# Patient Record
Sex: Male | Born: 1985 | Race: White | Hispanic: No | Marital: Single | Smoking: Current every day smoker
Health system: Southern US, Community
[De-identification: ages and names within clinical notes are randomized; demographics above are authoritative.]

## PROBLEM LIST (undated history)

## (undated) DIAGNOSIS — F419 Anxiety disorder, unspecified: Secondary | ICD-10-CM

## (undated) DIAGNOSIS — F429 Obsessive-compulsive disorder, unspecified: Secondary | ICD-10-CM

---
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
## 2003-11-03 ENCOUNTER — Emergency Department (HOSPITAL_COMMUNITY): Admission: EM | Admit: 2003-11-03 | Discharge: 2003-11-03 | Payer: Self-pay | Admitting: *Deleted

## 2012-05-18 ENCOUNTER — Emergency Department (HOSPITAL_BASED_OUTPATIENT_CLINIC_OR_DEPARTMENT_OTHER): Payer: Self-pay

## 2012-05-18 ENCOUNTER — Encounter (HOSPITAL_BASED_OUTPATIENT_CLINIC_OR_DEPARTMENT_OTHER): Payer: Self-pay | Admitting: *Deleted

## 2012-05-18 ENCOUNTER — Emergency Department (HOSPITAL_BASED_OUTPATIENT_CLINIC_OR_DEPARTMENT_OTHER)
Admission: EM | Admit: 2012-05-18 | Discharge: 2012-05-18 | Disposition: A | Discharge status: Home / Self Care | Payer: Self-pay | Attending: Emergency Medicine | Admitting: Emergency Medicine

## 2012-05-18 DIAGNOSIS — R042 Hemoptysis: Secondary | ICD-10-CM | POA: Insufficient documentation

## 2012-05-18 DIAGNOSIS — Z8659 Personal history of other mental and behavioral disorders: Secondary | ICD-10-CM | POA: Insufficient documentation

## 2012-05-18 DIAGNOSIS — F17200 Nicotine dependence, unspecified, uncomplicated: Secondary | ICD-10-CM | POA: Insufficient documentation

## 2012-05-18 HISTORY — DX: Anxiety disorder, unspecified: F41.9

## 2012-05-18 LAB — CBC WITH DIFFERENTIAL/PLATELET
Basophils Absolute: 0 10*3/uL (ref 0.0–0.1)
HCT: 43.9 % (ref 39.0–52.0)
Lymphocytes Relative: 31 % (ref 12–46)
MCH: 33.8 pg (ref 26.0–34.0)
MCHC: 37.1 g/dL — ABNORMAL HIGH (ref 30.0–36.0)
MCV: 91.1 fL (ref 78.0–100.0)
Monocytes Relative: 10 % (ref 3–12)
Neutro Abs: 2.7 10*3/uL (ref 1.7–7.7)
Neutrophils Relative %: 54 % (ref 43–77)
Platelets: 156 10*3/uL (ref 150–400)
RDW: 11.5 % (ref 11.5–15.5)

## 2012-05-18 NOTE — ED Notes (Signed)
Patient states he has been spitting up blood since the end of Nov 2013.  States he mainly has bloody sputum in the morning and after eating.  Not associated with coughing.  Feels like he may have acid reflux.  Also has swelling in the right lower abdomen.

## 2012-05-18 NOTE — ED Provider Notes (Signed)
History    CSN: [id number] Arrival date & time 05/18/12  1541 First MD Initiated Contact with Patient 05/18/12 1638      Chief Complaint  Patient presents with  . Hemoptysis    HPI Pt has been spitting up blood intermittently since November.  No coughing of blood.  No spitting of blood.  No blood in the stool.  Occsnl nosebleeds in the am.  He mostly has been noticing the blood in the morning when he spits.  He denies abdominal pain, fever, easy bruising or bleeding. No toothache or oral lesions noted Past Medical History  Diagnosis Date  . Anxiety     History reviewed. No pertinent past surgical history.  No family history on file.  History  Substance Use Topics  . Smoking status: Current Every Day Smoker -- 1.0 packs/day    Types: Cigarettes  . Smokeless tobacco: Not on file  . Alcohol Use: No      Review of Systems  All other systems reviewed and are negative.    Allergies  Review of patient's allergies indicates no known allergies.  Home Medications   Current Outpatient Rx  Name  Route  Sig  Dispense  Refill  . IBUPROFEN 400 MG PO TABS   Oral   Take 400 mg by mouth every 6 (six) hours as needed.           BP 144/86  Pulse 89  Temp 98.1 F (36.7 C) (Oral)  Resp 22  Ht 5\' 11"  (1.803 m)  Wt 170 lb (77.111 kg)  BMI 23.71 kg/m2  SpO2 100%  Physical Exam  Nursing note and vitals reviewed. Constitutional: He appears well-developed and well-nourished. No distress.  HENT:  Head: Normocephalic and atraumatic.  Right Ear: External ear normal.  Left Ear: External ear normal.  Mouth/Throat: No oropharyngeal exudate.       No lesions in mouth, no nosebleeds noted  Eyes: Conjunctivae normal are normal. Right eye exhibits no discharge. Left eye exhibits no discharge. No scleral icterus.  Neck: Neck supple. No tracheal deviation present.  Cardiovascular: Normal rate, regular rhythm and intact distal pulses.   Pulmonary/Chest: Effort normal and breath sounds  normal. No stridor. No respiratory distress. He has no wheezes. He has no rales.  Abdominal: Soft. Bowel sounds are normal. He exhibits no distension. There is no tenderness. There is no rebound and no guarding.  Musculoskeletal: He exhibits no edema and no tenderness.  Neurological: He is alert. He has normal strength. No sensory deficit. Cranial nerve deficit:  no gross defecits noted. He exhibits normal muscle tone. He displays no seizure activity. Coordination normal.  Skin: Skin is warm and dry. No rash noted.  Psychiatric: He has a normal mood and affect.    ED Course  Procedures (including critical care time)  Labs Reviewed  CBC WITH DIFFERENTIAL - Abnormal; Notable for the following:    MCHC 37.1 (*)  RULED OUT INTERFERING SUBSTANCES   All other components within normal limits   Dg Chest 2 View  05/18/2012  *RADIOLOGY REPORT*  Clinical Data: Hemoptysis.  Bloody sputum in the morning after eating.  Not associated with coughing.  Question of acid reflux.  CHEST - 2 VIEW  Comparison: 11/01/2003  Findings: The lungs are hyperinflated.  Heart size is normal. There are no focal consolidations or pleural effusions.  No pulmonary edema.  IMPRESSION:  1.  Hyperinflation. 2. No evidence for acute focal abnormality.   Original Report Authenticated By: Norva Pavlov, M.D.  MDM  No obvious source of his spitting of blood in the am.  He has not been actually vomiting blood or coughing up blood.  Could be related to nosebleeds.  At this time there does not appear to be any evidence of an acute emergency medical condition and the patient appears stable for discharge with appropriate outpatient follow up.         Celene Kras, MD 05/18/12 [phone]

## 2012-05-18 NOTE — ED Notes (Signed)
MD at bedside. 

## 2018-08-20 ENCOUNTER — Other Ambulatory Visit: Payer: Self-pay

## 2018-08-20 ENCOUNTER — Encounter (HOSPITAL_COMMUNITY): Payer: Self-pay

## 2018-08-20 ENCOUNTER — Emergency Department (HOSPITAL_COMMUNITY)
Admission: EM | Admit: 2018-08-20 | Discharge: 2018-08-20 | Disposition: A | Discharge status: Home / Self Care | Payer: Self-pay | Attending: Emergency Medicine | Admitting: Emergency Medicine

## 2018-08-20 DIAGNOSIS — Z5321 Procedure and treatment not carried out due to patient leaving prior to being seen by health care provider: Secondary | ICD-10-CM | POA: Insufficient documentation

## 2018-08-20 DIAGNOSIS — F10929 Alcohol use, unspecified with intoxication, unspecified: Secondary | ICD-10-CM | POA: Insufficient documentation

## 2018-08-20 NOTE — ED Triage Notes (Signed)
Pt reports that he wants help to stop drinking. States that he has been drinking every day for about a year. States that this is the first time that he has sought help. Endorses "several beers and half a pint of vodka" PTA. Denies SI.

## 2018-08-20 NOTE — ED Provider Notes (Signed)
Went to see patient and he was gone from the room. Asked nursing staff where he was, they reported that he decided he did not want to be seen and left without being seen. He was not seen by myself or other EDP prior to leaving.    [address], [place], [place] 08/20/18 1900    Charlynne Pander, MD 08/20/18 2026

## 2018-08-23 ENCOUNTER — Emergency Department (HOSPITAL_COMMUNITY)
Admission: EM | Admit: 2018-08-23 | Discharge: 2018-08-24 | Disposition: A | Discharge status: Other Acute Inpatient Hospital | Payer: Self-pay | Attending: Emergency Medicine | Admitting: Emergency Medicine

## 2018-08-23 DIAGNOSIS — Z046 Encounter for general psychiatric examination, requested by authority: Secondary | ICD-10-CM | POA: Insufficient documentation

## 2018-08-23 DIAGNOSIS — F332 Major depressive disorder, recurrent severe without psychotic features: Secondary | ICD-10-CM | POA: Diagnosis present

## 2018-08-23 DIAGNOSIS — F329 Major depressive disorder, single episode, unspecified: Secondary | ICD-10-CM | POA: Insufficient documentation

## 2018-08-23 DIAGNOSIS — R45851 Suicidal ideations: Secondary | ICD-10-CM | POA: Insufficient documentation

## 2018-08-23 DIAGNOSIS — F17210 Nicotine dependence, cigarettes, uncomplicated: Secondary | ICD-10-CM | POA: Insufficient documentation

## 2018-08-23 DIAGNOSIS — Y908 Blood alcohol level of 240 mg/100 ml or more: Secondary | ICD-10-CM | POA: Insufficient documentation

## 2018-08-23 DIAGNOSIS — F1010 Alcohol abuse, uncomplicated: Secondary | ICD-10-CM | POA: Insufficient documentation

## 2018-08-23 HISTORY — DX: Obsessive-compulsive disorder, unspecified: F42.9

## 2018-08-23 NOTE — ED Triage Notes (Addendum)
IVC SI  Pt was attending [facility] 6 to 9 months ago. Pt has history of Anxiety and OCD. Was taking medications up until he left [facility]. November pt. Tried to hang himself, but was stopped by Son. Today, Pt states wanting to hang himself again.   Pt. States having a broken heart. Pt. Unstable on feet. Reports drinking heavily. Pt. Drinks vodka and beer from the time he wakes up till the time he goes to bed. Pt states "i'm an alcoholic. I want to stop drinking." pt states possibility of losing son d/t drinking problem. Per pt. Mother of his child has abandoned them. Pt. States "i'm not violent. I'm just so sad." Pt verbalized multiple time wishes to die during triage. Pt asking "is it going to hurt?" referring to death. "I just don't want to hurt anymore."

## 2018-08-24 ENCOUNTER — Encounter (HOSPITAL_COMMUNITY): Payer: Self-pay

## 2018-08-24 ENCOUNTER — Other Ambulatory Visit: Payer: Self-pay | Admitting: Behavioral Health

## 2018-08-24 ENCOUNTER — Other Ambulatory Visit: Payer: Self-pay

## 2018-08-24 ENCOUNTER — Encounter (HOSPITAL_COMMUNITY): Payer: Self-pay | Admitting: Emergency Medicine

## 2018-08-24 ENCOUNTER — Inpatient Hospital Stay (HOSPITAL_COMMUNITY)
Admission: AD | Admit: 2018-08-24 | Discharge: 2018-08-27 | DRG: 885 | Disposition: A | Discharge status: Home / Self Care | Payer: No Typology Code available for payment source | Attending: Psychiatry | Admitting: Psychiatry

## 2018-08-24 DIAGNOSIS — Y908 Blood alcohol level of 240 mg/100 ml or more: Secondary | ICD-10-CM | POA: Diagnosis present

## 2018-08-24 DIAGNOSIS — F419 Anxiety disorder, unspecified: Secondary | ICD-10-CM | POA: Diagnosis present

## 2018-08-24 DIAGNOSIS — F332 Major depressive disorder, recurrent severe without psychotic features: Principal | ICD-10-CM

## 2018-08-24 DIAGNOSIS — F10229 Alcohol dependence with intoxication, unspecified: Secondary | ICD-10-CM | POA: Diagnosis present

## 2018-08-24 DIAGNOSIS — G4700 Insomnia, unspecified: Secondary | ICD-10-CM | POA: Diagnosis present

## 2018-08-24 DIAGNOSIS — R45851 Suicidal ideations: Secondary | ICD-10-CM | POA: Diagnosis present

## 2018-08-24 DIAGNOSIS — F17210 Nicotine dependence, cigarettes, uncomplicated: Secondary | ICD-10-CM | POA: Diagnosis present

## 2018-08-24 DIAGNOSIS — F10239 Alcohol dependence with withdrawal, unspecified: Secondary | ICD-10-CM | POA: Diagnosis present

## 2018-08-24 DIAGNOSIS — F1020 Alcohol dependence, uncomplicated: Secondary | ICD-10-CM | POA: Diagnosis present

## 2018-08-24 LAB — RAPID URINE DRUG SCREEN, HOSP PERFORMED
Amphetamines: NOT DETECTED
Barbiturates: NOT DETECTED
Benzodiazepines: POSITIVE — AB
Cocaine: NOT DETECTED
Opiates: NOT DETECTED
Tetrahydrocannabinol: NOT DETECTED

## 2018-08-24 LAB — CBC
HCT: 45.1 % (ref 39.0–52.0)
Hemoglobin: 16.2 g/dL (ref 13.0–17.0)
MCH: 36.6 pg — ABNORMAL HIGH (ref 26.0–34.0)
MCHC: 35.9 g/dL (ref 30.0–36.0)
MCV: 101.8 fL — ABNORMAL HIGH (ref 80.0–100.0)
Platelets: DECREASED 10*3/uL (ref 150–400)
RBC: 4.43 MIL/uL (ref 4.22–5.81)
RDW: 12.5 % (ref 11.5–15.5)
WBC: 4 10*3/uL (ref 4.0–10.5)
nRBC: 0 % (ref 0.0–0.2)

## 2018-08-24 LAB — COMPREHENSIVE METABOLIC PANEL
ALT: 112 U/L — ABNORMAL HIGH (ref 0–44)
AST: 148 U/L — ABNORMAL HIGH (ref 15–41)
Albumin: 4.2 g/dL (ref 3.5–5.0)
Alkaline Phosphatase: 77 U/L (ref 38–126)
Anion gap: 16 — ABNORMAL HIGH (ref 5–15)
BUN: 6 mg/dL (ref 6–20)
CO2: 20 mmol/L — ABNORMAL LOW (ref 22–32)
Calcium: 8.6 mg/dL — ABNORMAL LOW (ref 8.9–10.3)
Chloride: 104 mmol/L (ref 98–111)
Creatinine, Ser: 0.69 mg/dL (ref 0.61–1.24)
GFR calc Af Amer: >60 mL/min (ref >60)
GFR calc non Af Amer: >60 mL/min (ref >60)
Glucose, Bld: 86 mg/dL (ref 70–99)
Potassium: 3.7 mmol/L (ref 3.5–5.1)
Sodium: 140 mmol/L (ref 135–145)
Total Bilirubin: 1.2 mg/dL (ref 0.3–1.2)
Total Protein: 7.5 g/dL (ref 6.5–8.1)

## 2018-08-24 LAB — ETHANOL: Alcohol, Ethyl (B): 409 mg/dL (ref <10)

## 2018-08-24 MED ORDER — ONDANSETRON 4 MG PO TBDP: 4.0000 mg | ORAL_TABLET | Freq: Four times a day (QID) | ORAL | Status: DC | PRN

## 2018-08-24 MED ORDER — LOPERAMIDE HCL 2 MG PO CAPS: 2.0000 mg | ORAL_CAPSULE | ORAL | Status: DC | PRN

## 2018-08-24 MED ORDER — ONDANSETRON 4 MG PO TBDP
4.0000 mg | ORAL_TABLET | Freq: Four times a day (QID) | ORAL | Status: DC | PRN
  Administered 2018-08-24: 4 mg via ORAL
  Filled 2018-08-24: qty 1

## 2018-08-24 MED ORDER — VITAMIN B-1 100 MG PO TABS
100.0000 mg | ORAL_TABLET | Freq: Every day | ORAL | Status: DC
  Filled 2018-08-24 (×2): qty 1

## 2018-08-24 MED ORDER — LORAZEPAM 1 MG PO TABS
1.0000 mg | ORAL_TABLET | Freq: Four times a day (QID) | ORAL | Status: DC | PRN
  Administered 2018-08-24: 1 mg via ORAL
  Filled 2018-08-24: qty 1

## 2018-08-24 MED ORDER — LORAZEPAM 1 MG PO TABS
1.0000 mg | ORAL_TABLET | Freq: Two times a day (BID) | ORAL | Status: DC
  Filled 2018-08-24 (×2): qty 1

## 2018-08-24 MED ORDER — TRAZODONE HCL 50 MG PO TABS: 50.0000 mg | ORAL_TABLET | Freq: Every evening | ORAL | Status: DC | PRN

## 2018-08-24 MED ORDER — ONDANSETRON 4 MG PO TBDP
4.0000 mg | ORAL_TABLET | Freq: Four times a day (QID) | ORAL | Status: DC | PRN
  Filled 2018-08-24: qty 1

## 2018-08-24 MED ORDER — ACETAMINOPHEN 325 MG PO TABS: 650.0000 mg | ORAL_TABLET | Freq: Four times a day (QID) | ORAL | Status: DC | PRN

## 2018-08-24 MED ORDER — THIAMINE HCL 100 MG/ML IJ SOLN: 100.0000 mg | Freq: Once | INTRAMUSCULAR | Status: DC

## 2018-08-24 MED ORDER — ACAMPROSATE CALCIUM 333 MG PO TBEC
666.0000 mg | DELAYED_RELEASE_TABLET | Freq: Three times a day (TID) | ORAL | Status: DC
  Administered 2018-08-24 – 2018-08-27 (×8): 666 mg via ORAL
  Filled 2018-08-24 (×12): qty 2

## 2018-08-24 MED ORDER — VITAMIN B-1 100 MG PO TABS
100.0000 mg | ORAL_TABLET | Freq: Every day | ORAL | Status: DC
  Administered 2018-08-25 – 2018-08-27 (×3): 100 mg via ORAL
  Filled 2018-08-24 (×4): qty 1

## 2018-08-24 MED ORDER — NICOTINE POLACRILEX 2 MG MT GUM: 2.0000 mg | CHEWING_GUM | OROMUCOSAL | Status: DC | PRN

## 2018-08-24 MED ORDER — LORAZEPAM 1 MG PO TABS
1.0000 mg | ORAL_TABLET | Freq: Every day | ORAL | Status: DC
  Filled 2018-08-24: qty 1

## 2018-08-24 MED ORDER — LORAZEPAM 1 MG PO TABS: 1.0000 mg | ORAL_TABLET | Freq: Four times a day (QID) | ORAL | Status: DC | PRN

## 2018-08-24 MED ORDER — TRAZODONE HCL 50 MG PO TABS
50.0000 mg | ORAL_TABLET | Freq: Every evening | ORAL | Status: DC | PRN
  Administered 2018-08-24 – 2018-08-26 (×3): 50 mg via ORAL
  Filled 2018-08-24 (×5): qty 1

## 2018-08-24 MED ORDER — ALUM & MAG HYDROXIDE-SIMETH 200-200-20 MG/5ML PO SUSP: 30.0000 mL | ORAL | Status: DC | PRN

## 2018-08-24 MED ORDER — VITAMIN B-1 100 MG PO TABS: 100.0000 mg | ORAL_TABLET | Freq: Every day | ORAL | Status: DC

## 2018-08-24 MED ORDER — LORAZEPAM 1 MG PO TABS
1.0000 mg | ORAL_TABLET | Freq: Once | ORAL | Status: AC
  Administered 2018-08-24: 1 mg via ORAL
  Filled 2018-08-24: qty 1

## 2018-08-24 MED ORDER — ADULT MULTIVITAMIN W/MINERALS CH
1.0000 | ORAL_TABLET | Freq: Every day | ORAL | Status: DC
  Administered 2018-08-24: 1 via ORAL
  Filled 2018-08-24 (×4): qty 1

## 2018-08-24 MED ORDER — LORAZEPAM 1 MG PO TABS
1.0000 mg | ORAL_TABLET | Freq: Four times a day (QID) | ORAL | Status: AC
  Administered 2018-08-24 – 2018-08-25 (×4): 1 mg via ORAL
  Filled 2018-08-24 (×4): qty 1

## 2018-08-24 MED ORDER — HYDROXYZINE HCL 25 MG PO TABS
25.0000 mg | ORAL_TABLET | Freq: Four times a day (QID) | ORAL | Status: DC | PRN
  Administered 2018-08-26 (×2): 25 mg via ORAL
  Filled 2018-08-24 (×2): qty 1

## 2018-08-24 MED ORDER — HYDROXYZINE HCL 25 MG PO TABS: 25.0000 mg | ORAL_TABLET | Freq: Four times a day (QID) | ORAL | Status: DC | PRN

## 2018-08-24 MED ORDER — THIAMINE HCL 100 MG/ML IJ SOLN
100.0000 mg | Freq: Once | INTRAMUSCULAR | Status: AC
  Administered 2018-08-24: 100 mg via INTRAMUSCULAR
  Filled 2018-08-24: qty 2

## 2018-08-24 MED ORDER — NICOTINE 21 MG/24HR TD PT24
21.0000 mg | MEDICATED_PATCH | Freq: Every day | TRANSDERMAL | Status: DC
  Administered 2018-08-24 – 2018-08-27 (×4): 21 mg via TRANSDERMAL
  Filled 2018-08-24 (×6): qty 1

## 2018-08-24 MED ORDER — LORAZEPAM 1 MG PO TABS
1.0000 mg | ORAL_TABLET | Freq: Three times a day (TID) | ORAL | Status: AC
  Administered 2018-08-25 – 2018-08-26 (×3): 1 mg via ORAL
  Filled 2018-08-24 (×3): qty 1

## 2018-08-24 MED ORDER — ADULT MULTIVITAMIN W/MINERALS CH: 1.0000 | ORAL_TABLET | Freq: Every day | ORAL | Status: DC

## 2018-08-24 MED ORDER — LORAZEPAM 1 MG PO TABS
1.0000 mg | ORAL_TABLET | Freq: Four times a day (QID) | ORAL | Status: DC | PRN
  Administered 2018-08-26: 1 mg via ORAL

## 2018-08-24 MED ORDER — ADULT MULTIVITAMIN W/MINERALS CH
1.0000 | ORAL_TABLET | Freq: Every day | ORAL | Status: DC
  Administered 2018-08-24 – 2018-08-27 (×4): 1 via ORAL
  Filled 2018-08-24 (×5): qty 1

## 2018-08-24 MED ORDER — HYDROXYZINE HCL 25 MG PO TABS
25.0000 mg | ORAL_TABLET | Freq: Four times a day (QID) | ORAL | Status: DC | PRN
  Administered 2018-08-24: 25 mg via ORAL
  Filled 2018-08-24: qty 1

## 2018-08-24 NOTE — ED Notes (Signed)
After pt. completing triage in room 3 of [facility] ED pt. was being change into purple paper scrubs. Was in room with pt. assisting pt. to change into scrubs. Was in front of pt. assisting with taking pant off. Pt. instructed to sit in chair behind him.  Pt. slipped and feel on bottom. pt. intoxicated at time of fall last drink 1 hour prior to arrival. Press photographer and Doctor Horton Notified.  Pt. States having no pain. No bruising present.

## 2018-08-24 NOTE — ED Provider Notes (Signed)
  Physical Exam  BP 132/67 (BP Location: Right Arm)   Pulse 89   Temp 98.2 F (36.8 C) (Oral)   Resp 14   Ht 6\' 1"  (1.854 m)   Wt 68 kg   SpO2 94%   BMI 19.79 kg/m   Physical Exam  ED Course/Procedures     Procedures  MDM  Excepted behavioral health.  Finished up paperwork.  Examined patient.       Benjiman Core, MD 08/24/18 [phone]

## 2018-08-24 NOTE — Progress Notes (Signed)
Patient ID: [name], male   DOB: 1985/11/11, 33 y.o.   MRN: [id number]  Admission Note  D) Patient admitted to the adult unit 300 hall. Patient is a 33 year old male who is under [facility]C from [facility]. Patient appeared to be in withdrawal and was tremulous on approach. Patient states he has been drinking "a lot" for the last year. Patient denies SI/HI at this time but does endorse he needs help. Patient unsure if he wants long term treatment at this time and appears to be worried about getting back to his 60 year old son. Patient states his son's mother left one year ago and he has a court date 5/24 for stalking. Patient denies other substance use at this time.  Skin assessment was completed and unremarkable except for generalized bruises to his body from "falling down while drunk". Patient belongings searched with no contraband found. Belongings secured in locker. Vital signs obtained. Snacks and fluids offered.    A) Plan of care, unit policies and patient expectations were explained. Written consents obtained. Patient oriented to the unit and their room. Patient placed on standard q15 safety checks. High fall risk precautions initiated and reviewed with patient.   R) Patient is in no acute distress and verbalizes understanding of information provided. Patient with no concerns at this time. Patient contracts for safety with staff on the unit. Report given to receiving RN.

## 2018-08-24 NOTE — BH Assessment (Signed)
[facility] Assessment Progress Note   Pt BAL was 409 at 00:04.  TTS to see patient after 08:00.

## 2018-08-24 NOTE — ED Notes (Signed)
Pt arrived to Rm 51 via stretcher. Pt ambulated to bathroom and to room w/o difficulty. Pt noted to be alert, oriented, cooperative. Pt noted to be sleeping at this time - no tremors noted. Sitter w/pt.

## 2018-08-24 NOTE — Tx Team (Signed)
Initial Treatment Plan 08/24/2018 11:49 AM Janna Arch NLZ:[id number]    PATIENT STRESSORS: Legal issue Marital or family conflict Substance abuse   PATIENT STRENGTHS: Average or above average intelligence Capable of independent living General fund of knowledge Physical Health   PATIENT IDENTIFIED PROBLEMS: "help manage my withdrawal"  Substance Abuse  Suicide Risk                 DISCHARGE CRITERIA:  Ability to meet basic life and health needs Adequate post-discharge living arrangements Improved stabilization in mood, thinking, and/or behavior Medical problems require only outpatient monitoring Motivation to continue treatment in a less acute level of care Need for constant or close observation no longer present Reduction of life-threatening or endangering symptoms to within safe limits Safe-care adequate arrangements made Verbal commitment to aftercare and medication compliance Withdrawal symptoms are absent or subacute and managed without 24-hour nursing intervention  PRELIMINARY DISCHARGE PLAN: Outpatient therapy  PATIENT/FAMILY INVOLVEMENT: This treatment plan has been presented to and reviewed with the patient, [name].  The patient and family have been given the opportunity to ask questions and make suggestions.  Ferrel Logan, RN 08/24/2018, 11:49 AM
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
## 2018-08-24 NOTE — Progress Notes (Signed)
D: "I'm feeling a lot better." [name] denied SI, HI, and AVH. He denied most S/S of withdrawal but said he felt a little more sensitive to lights/noise. Slight tremor noted. He denied any hx of ETOH-related withdrawal seizures but said he worried about experiencing them while admitted. He was observed in the milieu interacting appropriately with peers.  A: Meds given as ordered. Q15 safety checks maintained. Support/encouragement offered. Explained role of Ativan in preventing seizures and offered reassurance.   R: Pt remains free from harm and continues with treatment. Will continue to monitor for needs/safety.

## 2018-08-24 NOTE — Progress Notes (Signed)
Patient ID: [name], male   DOB: 1985-11-06, 33 y.o.   MRN: [id number]  [place] NOVEL CORONAVIRUS (COVID-19) DAILY CHECK-OFF SYMPTOMS - answer yes or no to each - every day NO YES  Have you had a fever in the past 24 hours?  . Fever (Temp > 37.80C / 100F) X   Have you had any of these symptoms in the past 24 hours? . New Cough .  Sore Throat  .  Shortness of Breath .  Difficulty Breathing .  Unexplained Body Aches   X   Have you had any one of these symptoms in the past 24 hours not related to allergies?   . Runny Nose .  Nasal Congestion .  Sneezing   X   If you have had runny nose, nasal congestion, sneezing in the past 24 hours, has it worsened?  X   EXPOSURES - check yes or no X   Have you traveled outside the state in the past 14 days?  X   Have you been in contact with someone with a confirmed diagnosis of COVID-19 or PUI in the past 14 days without wearing appropriate PPE?  X   Have you been living in the same home as a person with confirmed diagnosis of COVID-19 or a PUI (household contact)?    X   Have you been diagnosed with COVID-19?    X              What to do next: Answered NO to all: Answered YES to anything:   Proceed with unit schedule Follow the [facility] Inpatient Flowsheet.

## 2018-08-24 NOTE — ED Provider Notes (Signed)
[facility] EMERGENCY DEPARTMENT Provider Note   CSN: [id number] Arrival date & time: 08/23/18  2340    History   Chief Complaint Chief Complaint  Patient presents with  . Suicidal    HPI [name] is a 33 y.o. male.     Patient presents to the emergency department with a chief complaint of suicidal thoughts.  He was brought to the emergency department under IVC.  Patient expressed desire to hang himself.  He is also a heavy alcoholic and has been drinking heavily.  He states that he is sad and depressed and just wants to be with his son.  He states that he does not feel violent.  He states that he would like to leave.  Denies drug use, but does endorse alcohol use.  Denies any other associated symptoms.  The history is provided by the patient. No language interpreter was used.    Past Medical History:  Diagnosis Date  . Anxiety   . OCD (obsessive compulsive disorder)     There are no active problems to display for this patient.   History reviewed. No pertinent surgical history.      Home Medications    Prior to Admission medications   Medication Sig Start Date End Date Taking? Authorizing Provider  ibuprofen (ADVIL,MOTRIN) 400 MG tablet Take 400 mg by mouth every 6 (six) hours as needed.    [provider]    Family History History reviewed. No pertinent family history.  Social History Social History   Tobacco Use  . Smoking status: Current Every Day Smoker    Packs/day: 1.00    Types: Cigarettes  . Smokeless tobacco: Never Used  Substance Use Topics  . Alcohol use: Yes  . Drug use: Not Currently     Allergies   Patient has no known allergies.   Review of Systems Review of Systems  All other systems reviewed and are negative.    Physical Exam Updated Vital Signs BP 131/82   Pulse 82   Temp 97.7 F (36.5 C) (Oral)   Resp 18   Ht 6\' 1"  (1.854 m)   Wt 68 kg   SpO2 95%   BMI 19.79 kg/m   Physical  Exam Vitals signs and nursing note reviewed.  Constitutional:      Appearance: He is well-developed.  HENT:     Head: Normocephalic and atraumatic.  Eyes:     Conjunctiva/sclera: Conjunctivae normal.  Neck:     Musculoskeletal: Neck supple.  Cardiovascular:     Rate and Rhythm: Normal rate and regular rhythm.     Heart sounds: No murmur.  Pulmonary:     Effort: Pulmonary effort is normal. No respiratory distress.     Breath sounds: Normal breath sounds.  Abdominal:     Palpations: Abdomen is soft.     Tenderness: There is no abdominal tenderness.  Skin:    General: Skin is warm and dry.  Neurological:     Mental Status: He is alert and oriented to person, place, and time.  Psychiatric:     Comments: Tearful, withdrawn      ED Treatments / Results  Labs (all labs ordered are listed, but only abnormal results are displayed) Labs Reviewed  COMPREHENSIVE METABOLIC PANEL - Abnormal; Notable for the following components:      Result Value   CO2 20 (*)    Calcium 8.6 (*)    AST 148 (*)    ALT 112 (*)  Anion gap 16 (*)    All other components within normal limits  ETHANOL - Abnormal; Notable for the following components:   Alcohol, Ethyl (B) 409 (*)    All other components within normal limits  CBC - Abnormal; Notable for the following components:   MCV 101.8 (*)    MCH 36.6 (*)    All other components within normal limits  RAPID URINE DRUG SCREEN, HOSP PERFORMED - Abnormal; Notable for the following components:   Benzodiazepines POSITIVE (*)    All other components within normal limits    EKG None  Radiology No results found.  Procedures Procedures (including critical care time)  Medications Ordered in ED Medications  LORazepam (ATIVAN) tablet 1 mg (1 mg Oral Given 08/24/18 0138)     Initial Impression / Assessment and Plan / ED Course  I have reviewed the triage vital signs and the nursing notes.  Pertinent labs & imaging results that were available  during my care of the patient were reviewed by me and considered in my medical decision making (see chart for details).        Patient under IVC for suicidal thoughts.  Has history of prior suicide attempts.  Heavy alcoholic.  TTS consulted.  Patient medically clear for psychiatric evaluation.  5:41 AM Still intoxicated and sleeping.  TTS consult pending.  Final Clinical Impressions(s) / ED Diagnoses   Final diagnoses:  Suicidal ideation  Alcohol abuse    ED Discharge Orders    None       Roxy HorsemanBrowning, Sierrah Luevano, PA-C 08/24/18 0541    Shon BatonHorton, Courtney F, MD 08/24/18 [phone]

## 2018-08-24 NOTE — ED Notes (Signed)
TTS at bedside. 

## 2018-08-24 NOTE — ED Notes (Addendum)
[employer] transporting pt to [facility] - ALL belongings - 2 labeled belongings bags - [employer]. Pt aware.

## 2018-08-24 NOTE — [facility] Suicide Risk Assessment (Addendum)
[facility] Admission Suicide Risk Assessment   Nursing information obtained from:  Patient, Review of record Demographic factors:  Male, Caucasian, Access to firearms Current Mental Status:  Suicidal ideation indicated by patient, Suicide plan, Plan includes specific time, place, or method, Self-harm thoughts, Self-harm behaviors, Intention to act on suicide plan Loss Factors:  Loss of significant relationship Historical Factors:  Prior suicide attempts, Family history of suicide, Anniversary of important loss, Impulsivity Risk Reduction Factors:  Sense of responsibility to family, Living with another person, especially a relative  Total Time spent with patient:45 minutes  Principal Problem: MDD (major depressive disorder), recurrent severe, without psychosis (HCC) Diagnosis:  Principal Problem:   MDD (major depressive disorder), recurrent severe, without psychosis (HCC) Active Problems:   Alcohol use disorder, severe, dependence (HCC)  Subjective Data:   Continued Clinical Symptoms:  Alcohol Use Disorder Identification Test Final Score (AUDIT): 35 The "Alcohol Use Disorders Identification Test", Guidelines for Use in Primary Care, Second Edition.  World Science writer [facility]). Score between 0-7:  no or low risk or alcohol related problems. Score between 8-15:  moderate risk of alcohol related problems. Score between 16-19:  high risk of alcohol related problems. Score 20 or above:  warrants further diagnostic evaluation for alcohol dependence and treatment.   CLINICAL FACTORS:  33 year old male, single,has a 47 year old son, currently unemployed, lives with his mother and his son. He presented to ED 5/11, under IVC, reporting depression and suicidal attempt by hanging self , with report that son had to intervene to prevent. Patient denies this, states " basically I just got really drunk".  Admission BAL 409. Does acknowledge his memory of events leading to admission is fragmented. States  he remembers feeling angry because he wanted to travel with his GF but feeling that his mother has been trying to prevent him leaving by controlling his money. Does acknowledge he has been experiencing depression following his teenaged son's mother leaving them last year, but states he has been feeling better after he developed a new relationship some months ago. Denies major neuro-vegetative symptoms leading up to admission, and states " I had actually been feeling better". He reports daily alcohol consumption over recent weeks, drinking 4-5 high concentration beers and sometimes liquor on most days. History of a prior psychiatric admission as a teenager for depression. Denies prior history of suicide attempts, remote history of self cutting at around age 33, denies history of psychosis, denies history of mania or hypomania. He was not taking medications prior to admission. History of alcohol dependence, states he has generally decrease amount he drinks . Denies drug abuse. Denies history of DTs or seizures, occasional blackouts, denies history of DUI.  Family history - father had history of depression and committed suicide by hanging in 2004. A maternal uncle had history of alcohol use disorder.  Dx- Alcohol Dependence, alcohol induced mood disorder versus MDD  Plan- Inpatient admission. Will change Ativan from PRN to standing detox protocol as patient remains  tremulous , tachycardic and slightly flushed following  Ativan PRN earlier today. We discussed starting antidepressant medication but currently declines/minimizes depression at this time. Has been started on Acamprosate for alcohol cravings .      Musculoskeletal: Strength & Muscle Tone: within normal limits (+) distal tremors, facial flushing , no psychomotor agitation Gait & Station: normal Patient leans: N/A  Psychiatric Specialty Exam: Physical Exam  ROS denies fever, no cough , no shortness of breath, no vomiting  Blood  pressure 124/80,  pulse 60, temperature 98.9 F (37.2 C), temperature source Oral, resp. rate 18, height 6\' 1"  (1.854 m), weight 80.3 kg, SpO2 93 %.Body mass index is 23.35 kg/m.  General Appearance: Fairly Groomed  Eye Contact:  Fair  Speech:  Normal Rate  Volume:  Decreased  Mood:  reports mood is "OK", presents vaguely depressed, anxious   Affect:  congruent , vaguely anxious  Thought Process:  Linear and Descriptions of Associations: Intact  Orientation:  Full (Time, Place, and Person)  Thought Content:  no hallucinations, no delusions, not internally preoccupied   Suicidal Thoughts:  No denies suicidal or self injurious ideations, denies homicidal or violent ideations, contracts for safety on unit   Homicidal Thoughts:  No  Memory:  recent and remote grossly intact   Judgement:  Fair  Insight:  Fair  Psychomotor Activity:  (+) distal tremors, but no overt restlessness or agitation  Concentration:  Concentration: Good and Attention Span: Good  Recall:  Good  Fund of Knowledge:  Good  Language:  Good  Akathisia:  Negative  Handed:  Right  AIMS (if indicated):     Assets:  Communication Skills Desire for Improvement Resilience  ADL's:  Intact  Cognition:  WNL  Sleep:         COGNITIVE FEATURES THAT CONTRIBUTE TO RISK:  Closed-mindedness and Loss of executive function    SUICIDE RISK:   Moderate:  Frequent suicidal ideation with limited intensity, and duration, some specificity in terms of plans, no associated intent, good self-control, limited dysphoria/symptomatology, some risk factors present, and identifiable protective factors, including available and accessible social support.  PLAN OF CARE: Patient will be admitted to inpatient psychiatric unit for stabilization and safety. Will provide and encourage milieu participation. Provide medication management and maked adjustments as needed.  Will also provide medication management to address alcohol WDL-Will follow daily.     I certify that inpatient services furnished can reasonably be expected to improve the patient's condition.   Craige CottaFernando A Zonnie Landen, MD 08/24/2018, 4:23 PM

## 2018-08-24 NOTE — ED Notes (Signed)
Report given to Ethelene Browns, RN at [facility].

## 2018-08-24 NOTE — ED Notes (Signed)
Ordered diet tray for pt  

## 2018-08-24 NOTE — ED Notes (Signed)
Belongings inventoried. Two bags placed in locker #6. No valuables to security or meds to pharmacy.
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
## 2018-08-24 NOTE — Progress Notes (Signed)
Pt accepted to [facility], Bed 306-2  Shuvon Rankin, NP is the accepting provider.  Nehemiah Massed, MD is the attending provider.  Call report to [phone]  [facility] ED notified.   Pt is IVC .  Pt may be transported by [facility] Pt scheduled  to arrive at [facility] as soon as transport can be arranged  Carney Bern T. Kaylyn Lim, MSW, LCSW Disposition Clinical Social Work [phone] (cell) [phone] (office)

## 2018-08-24 NOTE — ED Notes (Addendum)
Copies of IVC made and placed on chart. IVC faxed to [facility]. Copy of IVC placed in drawer for medical records. Originals placed in red folder in pyxis room. Pt is not in purple at this time. Belongings placed in locker #6. Not inventoried.

## 2018-08-25 LAB — LIPID PANEL
Cholesterol: 207 mg/dL — ABNORMAL HIGH (ref 0–200)
HDL: >135 mg/dL (ref >40)
Triglycerides: 20 mg/dL (ref <150)
VLDL: 4 mg/dL (ref 0–40)

## 2018-08-25 LAB — HEMOGLOBIN A1C
Hgb A1c MFr Bld: 4.9 % (ref 4.8–5.6)
Mean Plasma Glucose: 93.93 mg/dL

## 2018-08-25 LAB — TSH: TSH: 4.468 u[IU]/mL (ref 0.350–4.500)

## 2018-08-25 NOTE — Tx Team (Signed)
Interdisciplinary Treatment and Diagnostic Plan Update  08/25/2018 Time of Session: 9:21am Kermitt Harjo MRN: [id number]  Principal Diagnosis: MDD (major depressive disorder), recurrent severe, without psychosis ([place])  Secondary Diagnoses: Principal Problem:   MDD (major depressive disorder), recurrent severe, without psychosis ([place]) Active Problems:   Alcohol use disorder, severe, dependence ([place])   Current Medications:  Current Facility-Administered Medications  Medication Dose Route Frequency Provider Last Rate Last Dose  . acamprosate (CAMPRAL) tablet 666 mg  666 mg Oral TID WC Money, Lowry Ram, FNP   666 mg at 08/25/18 4496  . alum & mag hydroxide-simeth (MAALOX/MYLANTA) 200-200-20 MG/5ML suspension 30 mL  30 mL Oral Q4H PRN Mordecai Maes, NP      . hydrOXYzine (ATARAX/VISTARIL) tablet 25 mg  25 mg Oral Q6H PRN Cobos, Myer Peer, MD      . loperamide (IMODIUM) capsule 2-4 mg  2-4 mg Oral PRN Cobos, Myer Peer, MD      . LORazepam (ATIVAN) tablet 1 mg  1 mg Oral Q6H PRN Cobos, Myer Peer, MD      . LORazepam (ATIVAN) tablet 1 mg  1 mg Oral QID Cobos, Myer Peer, MD   1 mg at 08/25/18 7591   Followed by  . LORazepam (ATIVAN) tablet 1 mg  1 mg Oral TID Cobos, Myer Peer, MD       Followed by  . [START ON 08/26/2018] LORazepam (ATIVAN) tablet 1 mg  1 mg Oral BID Cobos, Myer Peer, MD       Followed by  . [START ON 08/28/2018] LORazepam (ATIVAN) tablet 1 mg  1 mg Oral Daily Cobos, Fernando A, MD      . multivitamin with minerals tablet 1 tablet  1 tablet Oral Daily Cobos, Myer Peer, MD   1 tablet at 08/25/18 0921  . nicotine (NICODERM CQ - dosed in mg/24 hours) patch 21 mg  21 mg Transdermal Daily Cobos, Myer Peer, MD   21 mg at 08/25/18 0921  . ondansetron (ZOFRAN-ODT) disintegrating tablet 4 mg  4 mg Oral Q6H PRN Cobos, Fernando A, MD      . thiamine (VITAMIN B-1) tablet 100 mg  100 mg Oral Daily Cobos, Myer Peer, MD   100 mg at 08/25/18 0921  . traZODone (DESYREL) tablet 50  mg  50 mg Oral QHS PRN Cobos, Myer Peer, MD   50 mg at 08/24/18 2104   PTA Medications: Medications Prior to Admission  Medication Sig Dispense Refill Last Dose  . ibuprofen (ADVIL,MOTRIN) 400 MG tablet Take 400 mg by mouth every 6 (six) hours as needed for moderate pain.    unk    Patient Stressors: Legal issue Marital or family conflict Substance abuse  Patient Strengths: Average or above average intelligence Capable of independent living General fund of knowledge Physical Health  Treatment Modalities: Medication Management, Group therapy, Case management,  1 to 1 session with clinician, Psychoeducation, Recreational therapy.   Physician Treatment Plan for Primary Diagnosis: MDD (major depressive disorder), recurrent severe, without psychosis ([place]) Long Term Goal(s): Improvement in symptoms so as ready for discharge Improvement in symptoms so as ready for discharge   Short Term Goals: Ability to identify changes in lifestyle to reduce recurrence of condition will improve Ability to verbalize feelings will improve Ability to disclose and discuss suicidal ideas Ability to demonstrate self-control will improve Ability to identify and develop effective coping behaviors will improve Ability to maintain clinical measurements within normal limits will improve Compliance with prescribed medications will improve Ability to identify  triggers associated with substance abuse/mental health issues will improve  Medication Management: Evaluate patient's response, side effects, and tolerance of medication regimen.  Therapeutic Interventions: 1 to 1 sessions, Unit Group sessions and Medication administration.  Evaluation of Outcomes: Not Met  Physician Treatment Plan for Secondary Diagnosis: Principal Problem:   MDD (major depressive disorder), recurrent severe, without psychosis ([place]) Active Problems:   Alcohol use disorder, severe, dependence ([place])  Long Term Goal(s): Improvement in  symptoms so as ready for discharge Improvement in symptoms so as ready for discharge   Short Term Goals: Ability to identify changes in lifestyle to reduce recurrence of condition will improve Ability to verbalize feelings will improve Ability to disclose and discuss suicidal ideas Ability to demonstrate self-control will improve Ability to identify and develop effective coping behaviors will improve Ability to maintain clinical measurements within normal limits will improve Compliance with prescribed medications will improve Ability to identify triggers associated with substance abuse/mental health issues will improve     Medication Management: Evaluate patient's response, side effects, and tolerance of medication regimen.  Therapeutic Interventions: 1 to 1 sessions, Unit Group sessions and Medication administration.  Evaluation of Outcomes: Not Met   RN Treatment Plan for Primary Diagnosis: MDD (major depressive disorder), recurrent severe, without psychosis ([place]) Long Term Goal(s): Knowledge of disease and therapeutic regimen to maintain health will improve  Short Term Goals: Ability to participate in decision making will improve, Ability to verbalize feelings will improve, Ability to disclose and discuss suicidal ideas, Ability to identify and develop effective coping behaviors will improve and Compliance with prescribed medications will improve  Medication Management: RN will administer medications as ordered by provider, will assess and evaluate patient's response and provide education to patient for prescribed medication. RN will report any adverse and/or side effects to prescribing provider.  Therapeutic Interventions: 1 on 1 counseling sessions, Psychoeducation, Medication administration, Evaluate responses to treatment, Monitor vital signs and CBGs as ordered, Perform/monitor CIWA, COWS, AIMS and Fall Risk screenings as ordered, Perform wound care treatments as  ordered.  Evaluation of Outcomes: Not Met   LCSW Treatment Plan for Primary Diagnosis: MDD (major depressive disorder), recurrent severe, without psychosis ([place]) Long Term Goal(s): Safe transition to appropriate next level of care at discharge, Engage patient in therapeutic group addressing interpersonal concerns.  Short Term Goals: Engage patient in aftercare planning with referrals and resources and Increase skills for wellness and recovery  Therapeutic Interventions: Assess for all discharge needs, 1 to 1 time with Social worker, Explore available resources and support systems, Assess for adequacy in community support network, Educate family and significant other(s) on suicide prevention, Complete Psychosocial Assessment, Interpersonal group therapy.  Evaluation of Outcomes: Not Met   Progress in Treatment: Attending groups: No. Participating in groups: No. Taking medication as prescribed: Yes. Toleration medication: Yes. Family/Significant other contact made: No, will contact:  will contact if given consent to contact Patient understands diagnosis: Yes. Discussing patient identified problems/goals with staff: Yes. Medical problems stabilized or resolved: Yes. Denies suicidal/homicidal ideation: Yes. Issues/concerns per patient self-inventory: No. Other:   New problem(s) identified: No, Describe:  None  New Short Term/Long Term Goal(s):  Patient Goals: "I want to get better"  Discharge Plan or Barriers:   Reason for Continuation of Hospitalization: Medication stabilization Withdrawal symptoms  Estimated Length of Stay: 3-5 days  Attendees: Patient: 08/25/2018   Physician: Dr. Neita Garnet, MD 08/25/2018  Nursing: Elberta Fortis, RN 08/25/2018   RN Care Manager: 08/25/2018  Social Worker: Ardelle Anton, LCSW 08/25/2018  Recreational Therapist:  08/25/2018  Other:  08/25/2018   Other:  08/25/2018   Other: 08/25/2018       Scribe for Treatment Team: Trecia Rogers,  LCSW 08/25/2018 9:39 AM

## 2018-08-25 NOTE — Progress Notes (Signed)
Adult Psychoeducational Group Note  Date:  08/25/2018 Time:  9:07 PM  Group Topic/Focus:  Wrap-Up Group:   The focus of this group is to help patients review their daily goal of treatment and discuss progress on daily workbooks.  Participation Level:  Active  Participation Quality:  Appropriate  Affect:  Appropriate  Cognitive:  Appropriate  Insight: Appropriate  Engagement in Group:  Engaged  Modes of Intervention:  Discussion  Additional Comments:  Pt stated his goal was to work on his discharge plan.  Pt stated he did meet his goal and rated the day at a 5/10.  Antonia Culbertson 08/25/2018, 9:07 PM

## 2018-08-25 NOTE — Progress Notes (Signed)
Recreation Therapy Notes  Date:  5.13.20 Time: 0930 Location: 300 Hall Dayroom  Group Topic: Stress Management  Goal Area(s) Addresses:  Patient will identify positive stress management techniques. Patient will identify benefits of using stress management post d/c.   Intervention:  Stress Management  Activity :  Progressive Muscle Relaxation.  LRT introduced the stress management technique of PMR.  Patients were to listen and follow along with the instructions given by LRT.    Education:  Stress Management, Discharge Planning.   Education Outcome: Acknowledges Education  Clinical Observations/Feedback:  Pt did not attend group.      Srihari Shellhammer, LRT/CTRS         Rakwon Letourneau A 08/25/2018 10:43 AM 

## 2018-08-25 NOTE — Progress Notes (Signed)
Patient ID: [name], male   DOB: 02/17/1986, 33 y.o.   MRN: [id number]  [place] NOVEL CORONAVIRUS (COVID-19) DAILY CHECK-OFF SYMPTOMS - answer yes or no to each - every day NO YES  Have you had a fever in the past 24 hours?  . Fever (Temp > 37.80C / 100F) X   Have you had any of these symptoms in the past 24 hours? . New Cough .  Sore Throat  .  Shortness of Breath .  Difficulty Breathing .  Unexplained Body Aches   X   Have you had any one of these symptoms in the past 24 hours not related to allergies?   . Runny Nose .  Nasal Congestion .  Sneezing   X   If you have had runny nose, nasal congestion, sneezing in the past 24 hours, has it worsened?  X   EXPOSURES - check yes or no X   Have you traveled outside the state in the past 14 days?  X   Have you been in contact with someone with a confirmed diagnosis of COVID-19 or PUI in the past 14 days without wearing appropriate PPE?  X   Have you been living in the same home as a person with confirmed diagnosis of COVID-19 or a PUI (household contact)?    X   Have you been diagnosed with COVID-19?    X              What to do next: Answered NO to all: Answered YES to anything:   Proceed with unit schedule Follow the [facility] Inpatient Flowsheet.    

## 2018-08-25 NOTE — Plan of Care (Signed)
  Problem: Activity: Goal: Interest or engagement in activities will improve Outcome: Progressing   Problem: Coping: Goal: Ability to verbalize frustrations and anger appropriately will improve Outcome: Progressing  D: Pt alert and oriented on the unit. Pt engaging with RN staff and other pts. Pt denies SI/HI, A/VH. Pt is pleasant and cooperative. A: Education, support and encouragement provided, q15 minute safety checks remain in effect. Medications administered per MD orders. R: No reactions/side effects to medicine noted. Pt denies any concerns at this time, and verbally contracts for safety. Pt ambulating on the unit with no issues. Pt remains safe on and off the unit.
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
## 2018-08-25 NOTE — Progress Notes (Signed)
Pt was invited to group via intercom, and the MHT went room to room to inform pts about group. Pt chose not to attend goals/orientation group.  

## 2018-08-25 NOTE — Progress Notes (Signed)
Pt endorsed nausea but when told he would need to avoid fluids for a time after taking Zofran ODT, he asked if he could take it after breakfast. He wanted to drink his morning coffee. Pt says he believes he can keep food down. He says he is feeling better, so much so that he is surprised.

## 2018-08-25 NOTE — Progress Notes (Signed)
D: Pt was in dayroom upon initial approach.  Pt presents with apporopriate affect and mood.  He reports his day was "pretty good."  His goal is "getting out of here" and he reports he feels safe to discharge.  Pt denies SI/HI, denies hallucinations, denies pain.  Pt has been visible in milieu interacting with peers and staff appropriately.  Pt attended evening group.    A: Introduced self to pt.  Met with pt 1:1.  Actively listened to pt and offered support and encouragement.  PRN medication administered for sleep.  Q15 minute safety checks in place.  R: Pt is safe on the unit.  Pt is compliant with medication.  Pt verbally contracts for safety.  Will continue to monitor and assess.

## 2018-08-25 NOTE — [facility] Suicide Risk Assessment (Signed)
[facility] INPATIENT:  Family/Significant Other Suicide Prevention Education  Suicide Prevention Education:  Contact Attempts: Pt's mother, [name], has been identified by the patient as the family member/significant other with whom the patient will be residing, and identified as the person(s) who will aid the patient in the event of a mental health crisis.  With written consent from the patient, two attempts were made to provide suicide prevention education, prior to and/or following the patient's discharge.  We were unsuccessful in providing suicide prevention education.  A suicide education pamphlet was given to the patient to share with family/significant other.  Date and time of first attempt: 08/25/2018 / 1pm Date and time of second attempt:  [name] 08/25/2018, 1:01 PM

## 2018-08-25 NOTE — Progress Notes (Addendum)
[facility]  08/25/2018 10:40 AM Janna ArchZacharias Honda  MRN:  [id number]   Patient is a 33 year old Caucasian male who [facility] under IVC after attempting to hang himself while he was intoxicated with a BAL of 409.  Subjective: Patient reports today that he is feeling much better than he did yesterday after he was receiving medications for withdrawal symptoms from alcohol.  He denies any suicidal or homicidal ideations and denies any hallucinations.  Patient reports that his sleep did improve his appetite has been great since he has been here.  Patient is fixated on being discharged home, but then also states that he understands and needs to be here to finish detox so that he can remain sober after he leaves.Patient denies any medication side effects and reports that the Campral has decreased his cravings tremendously.  Objective: Patient's chart and findings reviewed and discussed with treatment team.  Patient presents in the day room has been seen on the phone.  Patient has not been attending groups and but has been interacting with peers and staff appropriately.  Principal Problem: MDD (major depressive disorder), recurrent severe, without psychosis (HCC) Diagnosis: Principal Problem:   MDD (major depressive disorder), recurrent severe, without psychosis (HCC) Active Problems:   Alcohol use disorder, severe, dependence (HCC)  Total Time spent with patient: 20 minutes  Past Psychiatric History: See H&P  Past Medical History:  Past Medical History:  Diagnosis Date  . Anxiety   . OCD (obsessive compulsive disorder)    History reviewed. No pertinent surgical history. Family History: History reviewed. No pertinent family history. Family Psychiatric  History: See H&P Social History:  Social History   Substance and Sexual Activity  Alcohol Use Yes     Social History   Substance and Sexual Activity  Drug Use Not Currently    Social History    Socioeconomic History  . Marital status: Single    Spouse name: Not on file  . Number of children: Not on file  . Years of education: Not on file  . Highest education level: Not on file  Occupational History  . Not on file  Social Needs  . Financial resource strain: Not on file  . Food insecurity:    Worry: Not on file    Inability: Not on file  . Transportation needs:    Medical: Not on file    Non-medical: Not on file  Tobacco Use  . Smoking status: Current Every Day Smoker    Packs/day: 1.00    Types: Cigarettes  . Smokeless tobacco: Never Used  Substance and Sexual Activity  . Alcohol use: Yes  . Drug use: Not Currently  . Sexual activity: Not on file  Lifestyle  . Physical activity:    Days per week: Not on file    Minutes per session: Not on file  . Stress: Not on file  Relationships  . Social connections:    Talks on phone: Not on file    Gets together: Not on file    Attends religious service: Not on file    Active member of club or organization: Not on file    Attends meetings of clubs or organizations: Not on file    Relationship status: Not on file  Other Topics Concern  . Not on file  Social History Narrative  . Not on file   Additional Social History:  Sleep: Good  Appetite:  Good  Current Medications: Current Facility-Administered Medications  Medication Dose Route Frequency Provider Last Rate Last Dose  . acamprosate (CAMPRAL) tablet 666 mg  666 mg Oral TID WC Money, Gerlene Burdock, FNP   666 mg at 08/25/18 5537  . alum & mag hydroxide-simeth (MAALOX/MYLANTA) 200-200-20 MG/5ML suspension 30 mL  30 mL Oral Q4H PRN Denzil Magnuson, NP      . hydrOXYzine (ATARAX/VISTARIL) tablet 25 mg  25 mg Oral Q6H PRN Misao Fackrell, Rockey Situ, MD      . loperamide (IMODIUM) capsule 2-4 mg  2-4 mg Oral PRN Lane Eland, Rockey Situ, MD      . LORazepam (ATIVAN) tablet 1 mg  1 mg Oral Q6H PRN Brownie Gockel, Rockey Situ, MD      . LORazepam (ATIVAN) tablet  1 mg  1 mg Oral QID Jos Cygan, Rockey Situ, MD   1 mg at 08/25/18 4827   Followed by  . LORazepam (ATIVAN) tablet 1 mg  1 mg Oral TID Mihail Prettyman, Rockey Situ, MD       Followed by  . [START ON 08/26/2018] LORazepam (ATIVAN) tablet 1 mg  1 mg Oral BID Adreonna Yontz, Rockey Situ, MD       Followed by  . [START ON 08/28/2018] LORazepam (ATIVAN) tablet 1 mg  1 mg Oral Daily Yanelly Cantrelle A, MD      . multivitamin with minerals tablet 1 tablet  1 tablet Oral Daily Ailea Rhatigan, Rockey Situ, MD   1 tablet at 08/25/18 0921  . nicotine (NICODERM CQ - dosed in mg/24 hours) patch 21 mg  21 mg Transdermal Daily Donda Friedli, Rockey Situ, MD   21 mg at 08/25/18 0921  . ondansetron (ZOFRAN-ODT) disintegrating tablet 4 mg  4 mg Oral Q6H PRN Amillion Scobee A, MD      . thiamine (VITAMIN B-1) tablet 100 mg  100 mg Oral Daily Joyann Spidle, Rockey Situ, MD   100 mg at 08/25/18 0921  . traZODone (DESYREL) tablet 50 mg  50 mg Oral QHS PRN Aoife Bold, Rockey Situ, MD   50 mg at 08/24/18 2104    Lab Results:  Results for orders placed or performed during the hospital encounter of 08/24/18 (from the past 48 hour(s))  Lipid panel     Status: Abnormal   Collection Time: 08/25/18  6:34 AM  Result Value Ref Range   Cholesterol 207 (H) 0 - 200 mg/dL   Triglycerides 20 <078 mg/dL   HDL >675 >44 mg/dL   Total CHOL/HDL Ratio NOT CALCULATED RATIO   VLDL 4 0 - 40 mg/dL   LDL Cholesterol NOT CALCULATED 0 - 99 mg/dL    Comment: Performed at [facility], [address] [place], [place] [address]  TSH     Status: None   Collection Time: 08/25/18  6:34 AM  Result Value Ref Range   TSH 4.468 0.350 - 4.500 uIU/mL    Comment: Performed by a 3rd Generation assay with a functional sensitivity of <=0.01 uIU/mL. Performed at [facility], [address] [place], [place] [address]     Blood Alcohol level:  Lab Results  Component Value Date   ETH 409 [facility]) 08/24/2018    Metabolic Disorder Labs: No results found for: HGBA1C,  MPG No results found for: PROLACTIN Lab Results  Component Value Date   CHOL 207 (H) 08/25/2018   TRIG 20 08/25/2018   HDL >135 08/25/2018   CHOLHDL NOT CALCULATED 08/25/2018   VLDL 4 08/25/2018   LDLCALC  NOT CALCULATED 08/25/2018    Physical Findings: AIMS: Facial and Oral Movements Muscles of Facial Expression: None, normal Lips and Perioral Area: None, normal Jaw: None, normal Tongue: None, normal,Extremity Movements Upper (arms, wrists, hands, fingers): None, normal Lower (legs, knees, ankles, toes): None, normal, Trunk Movements Neck, shoulders, hips: None, normal, Overall Severity Severity of abnormal movements (highest score from questions above): None, normal Incapacitation due to abnormal movements: None, normal Patient's awareness of abnormal movements (rate only patient's report): No Awareness, Dental Status Current problems with teeth and/or dentures?: No Does patient usually wear dentures?: No  CIWA:  CIWA-Ar Total: 4 COWS:     Musculoskeletal: Strength & Muscle Tone: within normal limits Gait & Station: normal Patient leans: N/A  Psychiatric Specialty Exam: Physical Exam  Nursing note and vitals reviewed. Constitutional: He is oriented to person, place, and time. He appears well-developed and well-nourished.  Respiratory: Effort normal.  Musculoskeletal: Normal range of motion.  Neurological: He is alert and oriented to person, place, and time.  Skin: Skin is warm.    Review of Systems  Constitutional: Negative.   HENT: Negative.   Eyes: Negative.   Respiratory: Negative.   Cardiovascular: Negative.   Gastrointestinal: Negative.   Genitourinary: Negative.   Musculoskeletal: Negative.   Skin: Negative.   Neurological: Negative.   Endo/Heme/Allergies: Negative.   Psychiatric/Behavioral: Positive for substance abuse.    Blood pressure (!) 137/103, pulse (!) 108, temperature 98.5 F (36.9 C), temperature source Oral, resp. rate 20, height   (1.854 m), weight 80.3 kg, SpO2 99 %.Body mass index is 23.35 kg/m.  General Appearance: Casual  Eye Contact:  Good  Speech:  Clear and Coherent and Normal Rate  Volume:  Normal  Mood:  Euthymic  Affect:  Congruent  Thought Process:  Coherent and Descriptions of Associations: Intact  Orientation:  Full (Time, Place, and Person)  Thought Content:  WDL  Suicidal Thoughts:  No  Homicidal Thoughts:  No  Memory:  Immediate;   Good Recent;   Good Remote;   Good  Judgement:  Fair  Insight:  Fair  Psychomotor Activity:  Normal  Concentration:  Concentration: Good and Attention Span: Good  Recall:  Good  Fund of Knowledge:  Good  Language:  Good  Akathisia:  No  Handed:  Right  AIMS (if indicated):     Assets:  Communication Skills Desire for Improvement Financial Resources/Insurance Housing Physical Health Social Support Transportation  ADL's:  Intact  Cognition:  WNL  Sleep:  Number of Hours: 6.25   Problems Addressed MDD severe recurrent Alcohol use disorder severe dependence  Treatment Plan Summary: Daily contact with patient to assess and evaluate symptoms and progress in treatment, Medication management and Plan is to:  Continue Campral 666 mg PO TID for alcohol use Continue Ativan Detox Protocol Continue Trazodone 50 mg PO QHS PRN for insomnia Encourage group therapy participation Continue Q15 minute safety checks CSW to assist with disposition planning  Maryfrances Bunnell, FNP 08/25/2018, 10:40 AM   Attest to NP Progress Note

## 2018-08-26 MED ORDER — CLONIDINE HCL 0.1 MG PO TABS: 0.1000 mg | ORAL_TABLET | Freq: Three times a day (TID) | ORAL | Status: DC | PRN

## 2018-08-26 NOTE — Plan of Care (Signed)
D: Patient is alert, oriented, pleasant, and cooperative. Denies SI, HI, AVH, and verbally contracts for safety. Patient reports he is feeling good and very appreciative of the help he has received here. Patient denies physical symptoms/pain.    A: Medications administered per MD order. Support provided. Patient educated on safety on the unit and medications. Routine safety checks every 15 minutes. Patient stated understanding to tell nurse about any new physical symptoms. Patient understands to tell staff of any needs.     R: No adverse drug reactions noted. Patient verbally contracts for safety. Patient remains safe at this time and will continue to monitor.   Problem: Education: Goal: Mental status will improve Outcome: Progressing   Problem: Safety: Goal: Periods of time without injury will increase Outcome: Progressing   Patient denies SI, HI, AVH, and contracts for safety. Patient remains safe and will continue to monitor.   [place] NOVEL CORONAVIRUS (COVID-19) DAILY CHECK-OFF SYMPTOMS - answer yes or no to each - every day NO YES  Have you had a fever in the past 24 hours?  Fever (Temp > 37.80C / 100F) X   Have you had any of these symptoms in the past 24 hours? New Cough  Sore Throat   Shortness of Breath  Difficulty Breathing  Unexplained Body Aches   X   Have you had any one of these symptoms in the past 24 hours not related to allergies?   Runny Nose  Nasal Congestion  Sneezing   X   If you have had runny nose, nasal congestion, sneezing in the past 24 hours, has it worsened?  X   EXPOSURES - check yes or no X   Have you traveled outside the state in the past 14 days?  X   Have you been in contact with someone with a confirmed diagnosis of COVID-19 or PUI in the past 14 days without wearing appropriate PPE?  X   Have you been living in the same home as a person with confirmed diagnosis of COVID-19 or a PUI (household contact)?    X   Have you been diagnosed  with COVID-19?    X              What to do next: Answered NO to all: Answered YES to anything:   Proceed with unit schedule Follow the [facility] Inpatient Flowsheet.

## 2018-08-26 NOTE — [facility] Group Notes (Signed)
[facility] LCSW Group Therapy Note  Date/Time: 08/26/2018 ; 1:30pm  Type of Therapy/Topic:  Group Therapy:  Feelings about Diagnosis  Participation Level:  Active   Mood: Pleasant   Description of Group:    This group will allow patients to explore their thoughts and feelings about diagnoses they have received. Patients will be guided to explore their level of understanding and acceptance of these diagnoses. Facilitator will encourage patients to process their thoughts and feelings about the reactions of others to their diagnosis, and will guide patients in identifying ways to discuss their diagnosis with significant others in their lives. This group will be process-oriented, with patients participating in exploration of their own experiences as well as giving and receiving support and challenge from other group members.   Therapeutic Goals: 1. Patient will demonstrate understanding of diagnosis as evidence by identifying two or more symptoms of the disorder:  2. Patient will be able to express two feelings regarding the diagnosis 3. Patient will demonstrate ability to communicate their needs through discussion and/or role plays  Summary of Patient Progress:    Pt was engaged throughout group. Pt appeared tearful at the beginning of group and discussed not wanting to talk too in depth because he did not want to cry. Pt explored his thoughts and feelings around the diagnosis that he has and the stigma that he has received from having a mental health diagnosis. Pt discussed stigmas that individuals experiencing having a mental health diagnosis receive. 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 Pt engaged with other group members in regards to their substance abuse diagnosis and gave insight on his own detox experience.     Therapeutic Modalities:   Cognitive Behavioral Therapy Brief Therapy Feelings Identification   Stephannie Peters, LCSW

## 2018-08-26 NOTE — [facility] Suicide Risk Assessment (Signed)
[facility] INPATIENT:  Family/Significant Other Suicide Prevention Education  Suicide Prevention Education:  Education Completed; Pt's mother, [name], has been identified by the patient as the family member/significant other with whom the patient will be residing, and identified as the person(s) who will aid the patient in the event of a mental health crisis (suicidal ideations/suicide attempt).  With written consent from the patient, the family member/significant other has been provided the following suicide prevention education, prior to the and/or following the discharge of the patient.  The suicide prevention education provided includes the following:  Suicide risk factors  Suicide prevention and interventions  National Suicide Hotline telephone number  [facility] assessment telephone number  [facility] Emergency Assistance 911  [facility] and/or Residential Mobile Crisis Unit telephone number  Request made of family/significant other to:  Remove weapons (e.g., guns, rifles, knives), all items previously/currently identified as safety concern.    Remove drugs/medications (over-the-counter, prescriptions, illicit drugs), all items previously/currently identified as a safety concern.  The family member/significant other verbalizes understanding of the suicide prevention education information provided.  The family member/significant other agrees to remove the items of safety concern listed above.   Pt's mother, [name]. Pt's mother reports that he sounds good to her. Pt's mother reports that she hopes that he can stay until tomorrow. Pt's mother asks if the pt can obtain medicaid. CSW confirmed that they would provide the pt with that information with his discharge paperwork. Pt's mother reports that she is okay with the patient coming home either today or tomorrow. Pt's mother stated that she didn't know that she could call or contact, but stated to just  let her know when he is able to discharge and she will be picking him up. Pt's mother asked if the pt was continuing to take his medications. CSW confirmed that information.    [name] 08/26/2018, 10:20 AM

## 2018-08-26 NOTE — Progress Notes (Addendum)
[facility]  08/26/2018 4:35 PM Janna ArchZacharias Beverlin  MRN:  [id number]   Subjective: patient reports he is feeling better. Denies any suicidal or self injurious ideations. States he is hopeful for discharge soon, looking forward to seeing his mother and his son.  Denies medication side effects.  Objective: Case reviewed with treatment team and patient seen. 33 year old single male, lives with his mother and his son. Presented under IVC for depression, suicidal attempt by hanging self, alcohol dependence/  intoxication , with admission BAL 409.   At this time patient denies suicidal ideations and attributes recent suicidal ideations/attempt to " being drunk". States " I have a lot to live for , I love my son" . At this time presents future oriented, and hopeful for discharge soon. He denies WDL symptoms, but does continue to present with some distal tremors , no diaphoresis, no restlessness or agitation. CSW has spoken with mother, who corroborates patient presents with improvement and is in agreement with discharge planning/ patient returning home following discharge. Denies medication side effects. No disruptive or agitated behaviors on unit, visible in day room,  going to some groups.     Principal Problem: MDD (major depressive disorder), recurrent severe, without psychosis (HCC) Diagnosis: Principal Problem:   MDD (major depressive disorder), recurrent severe, without psychosis (HCC) Active Problems:   Alcohol use disorder, severe, dependence (HCC)  Total Time spent with patient: 20 minutes  Past Psychiatric History: See H&P  Past Medical History:  Past Medical History:  Diagnosis Date  . Anxiety   . OCD (obsessive compulsive disorder)    History reviewed. No pertinent surgical history. Family History: History reviewed. No pertinent family history. Family Psychiatric  History: See H&P Social History:  Social History   Substance and Sexual Activity  Alcohol Use Yes      Social History   Substance and Sexual Activity  Drug Use Not Currently    Social History   Socioeconomic History  . Marital status: Single    Spouse name: Not on file  . Number of children: Not on file  . Years of education: Not on file  . Highest education level: Not on file  Occupational History  . Not on file  Social Needs  . Financial resource strain: Not on file  . Food insecurity:    Worry: Not on file    Inability: Not on file  . Transportation needs:    Medical: Not on file    Non-medical: Not on file  Tobacco Use  . Smoking status: Current Every Day Smoker    Packs/day: 1.00    Types: Cigarettes  . Smokeless tobacco: Never Used  Substance and Sexual Activity  . Alcohol use: Yes  . Drug use: Not Currently  . Sexual activity: Not on file  Lifestyle  . Physical activity:    Days per week: Not on file    Minutes per session: Not on file  . Stress: Not on file  Relationships  . Social connections:    Talks on phone: Not on file    Gets together: Not on file    Attends religious service: Not on file    Active member of club or organization: Not on file    Attends meetings of clubs or organizations: Not on file    Relationship status: Not on file  Other Topics Concern  . Not on file  Social History Narrative  . Not on file   Additional Social History:   Sleep:  Fair  Appetite:  improving   Current Medications: Current Facility-Administered Medications  Medication Dose Route Frequency Provider Last Rate Last Dose  . acamprosate (CAMPRAL) tablet 666 mg  666 mg Oral TID WC Money, Gerlene Burdock, FNP   666 mg at 08/26/18 1207  . alum & mag hydroxide-simeth (MAALOX/MYLANTA) 200-200-20 MG/5ML suspension 30 mL  30 mL Oral Q4H PRN Denzil Magnuson, NP      . hydrOXYzine (ATARAX/VISTARIL) tablet 25 mg  25 mg Oral Q6H PRN , Rockey Situ, MD   25 mg at 08/26/18 0134  . loperamide (IMODIUM) capsule 2-4 mg  2-4 mg Oral PRN , Rockey Situ, MD      . LORazepam  (ATIVAN) tablet 1 mg  1 mg Oral Q6H PRN , Rockey Situ, MD      . LORazepam (ATIVAN) tablet 1 mg  1 mg Oral BID , Rockey Situ, MD       Followed by  . [START ON 08/28/2018] LORazepam (ATIVAN) tablet 1 mg  1 mg Oral Daily ,  A, MD      . multivitamin with minerals tablet 1 tablet  1 tablet Oral Daily , Rockey Situ, MD   1 tablet at 08/26/18 0809  . nicotine (NICODERM CQ - dosed in mg/24 hours) patch 21 mg  21 mg Transdermal Daily , Rockey Situ, MD   21 mg at 08/26/18 0813  . ondansetron (ZOFRAN-ODT) disintegrating tablet 4 mg  4 mg Oral Q6H PRN ,  A, MD      . thiamine (VITAMIN B-1) tablet 100 mg  100 mg Oral Daily , Rockey Situ, MD   100 mg at 08/26/18 0810  . traZODone (DESYREL) tablet 50 mg  50 mg Oral QHS PRN , Rockey Situ, MD   50 mg at 08/25/18 2132    Lab Results:  Results for orders placed or performed during the hospital encounter of 08/24/18 (from the past 48 hour(s))  Hemoglobin A1c     Status: None   Collection Time: 08/25/18  6:34 AM  Result Value Ref Range   Hgb A1c MFr Bld 4.9 4.8 - 5.6 %    Comment: (NOTE) Pre diabetes:          5.7%-6.4% Diabetes:              >6.4% Glycemic control for   <7.0% adults with diabetes    Mean Plasma Glucose 93.93 mg/dL    Comment: Performed at [facility], [address], [place], [place] [address]  Lipid panel     Status: Abnormal   Collection Time: 08/25/18  6:34 AM  Result Value Ref Range   Cholesterol 207 (H) 0 - 200 mg/dL   Triglycerides 20 <846 mg/dL   HDL >962 >95 mg/dL   Total CHOL/HDL Ratio NOT CALCULATED RATIO   VLDL 4 0 - 40 mg/dL   LDL Cholesterol NOT CALCULATED 0 - 99 mg/dL    Comment: Performed at [facility], [address] [place], [place] [address]  TSH     Status: None   Collection Time: 08/25/18  6:34 AM  Result Value Ref Range   TSH 4.468 0.350 - 4.500 uIU/mL    Comment: Performed by a 3rd Generation assay with a functional sensitivity  of <=0.01 uIU/mL. Performed at [facility], [address], [place], [place] [address]     Blood Alcohol level:  Lab Results  Component Value Date   ETH [address] [facility]) 08/24/2018    Metabolic Disorder  Labs: Lab Results  Component Value Date   HGBA1C 4.9 08/25/2018   MPG 93.93 08/25/2018   No results found for: PROLACTIN Lab Results  Component Value Date   CHOL 207 (H) 08/25/2018   TRIG 20 08/25/2018   HDL >135 08/25/2018   CHOLHDL NOT CALCULATED 08/25/2018   VLDL 4 08/25/2018   LDLCALC NOT CALCULATED 08/25/2018    Physical Findings: AIMS: Facial and Oral Movements Muscles of Facial Expression: None, normal Lips and Perioral Area: None, normal Jaw: None, normal Tongue: None, normal,Extremity Movements Upper (arms, wrists, hands, fingers): None, normal Lower (legs, knees, ankles, toes): None, normal, Trunk Movements Neck, shoulders, hips: None, normal, Overall Severity Severity of abnormal movements (highest score from questions above): None, normal Incapacitation due to abnormal movements: None, normal Patient's awareness of abnormal movements (rate only patient's report): No Awareness, Dental Status Current problems with teeth and/or dentures?: No Does patient usually wear dentures?: No  CIWA:  CIWA-Ar Total: 1 COWS:     Musculoskeletal: Strength & Muscle Tone: within normal limits mild distal tremors noted, no restlessness or agitation Gait & Station: normal Patient leans: N/A  Psychiatric Specialty Exam: Physical Exam  Nursing note and vitals reviewed. Constitutional: He is oriented to person, place, and time. He appears well-developed and well-nourished.  Respiratory: Effort normal.  Musculoskeletal: Normal range of motion.  Neurological: He is alert and oriented to person, place, and time.  Skin: Skin is warm.    Review of Systems  Constitutional: Negative.   HENT: Negative.   Eyes: Negative.   Respiratory: Negative.   Cardiovascular:  Negative.   Gastrointestinal: Negative.   Genitourinary: Negative.   Musculoskeletal: Negative.   Skin: Negative.   Neurological: Negative.   Endo/Heme/Allergies: Negative.   Psychiatric/Behavioral: Positive for substance abuse.  denies headache, no chest pain, no shortness of breath, no vomiting , no fever   Blood pressure (!) 146/104, pulse 89, temperature 98.1 F (36.7 C), temperature source Oral, resp. rate 20, height 6\' 1"  (1.854 m), weight 80.3 kg, SpO2 99 %.Body mass index is 23.35 kg/m.  General Appearance: improving grooming   Eye Contact:  Good  Speech:  Clear and Coherent and Normal Rate  Volume:  Normal  Mood:  improving mood , states he is feeling better  Affect:  Appropriate and vaguely anxious, fuller in range  Thought Process:  Linear and Descriptions of Associations: Intact  Orientation:  Other:  fully alert and attentive  Thought Content:  no hallucinations, no delusions, not internally preoccupied   Suicidal Thoughts:  No denies suicidal or self injurious ideations, no homicidal or violent ideations   Homicidal Thoughts:  No  Memory:  recent and remote grossly intact   Judgement:  Other:  improving   Insight:  Fair/ improving   Psychomotor Activity:  Normal  Concentration:  Concentration: Good and Attention Span: Good  Recall:  Good  Fund of Knowledge:  Good  Language:  Good  Akathisia:  No  Handed:  Right  AIMS (if indicated):     Assets:  Communication Skills Desire for Improvement Financial Resources/Insurance Housing Physical Health Social Support Transportation  ADL's:  Intact  Cognition:  WNL  Sleep:  Number of Hours: 4.25   Assessment :  33 year old single male, lives with his mother and his son. Presented under IVC for depression, suicidal attempt by hanging self, alcohol dependence/  intoxication , with admission BAL 409.  Patient reports he is feeling better, denies suicidal ideations, currently denies significant  depression or  neuro-vegetative symptoms and presents future oriented, hoping for discharge soon. Minimizes withdrawal symptoms, but does continue to present with some distal tremors and elevated BP, although not tachycardic.Tolerating Ativan detox protocol well. Has also been started on Campral trial to help with abstinence efforts .  He denies depression . He does endorse history of anxiety, which he states is more noticeable when he is not in his home environment/ interacting with persons he does not know well . We have discussed medication options -is not currently interested in starting an antidepressant trial. * Have reviewed elevated BP readings with patient ( Repeat manual  BP remains elevated at 160/110). Denies any associated symptoms - no headache, no visual disturbances,  no chest pain. States that his BP at home is usually within normal.  Not interested in starting B blocker or other antihypertensive management at this time, but does agree  to Clonidine on a PRN basis  for elevated BP if needed        Treatment Plan Summary: Daily contact with patient to assess and evaluate symptoms and progress in treatment, Medication management and Plan is to:  Treatment plan reviewed as below today 5/14  Encourage group and milieu participation Encourage efforts to work on sobriety and relapse prevention efforts  Treatment team working on disposition planning options Continue Campral 666 mg PO TID for alcohol use Start Clonidine 0.1 mgr TID PRN for systolic BP  > 180, diastolic >161  Continue Ativan Detox Protocol to address alcohol WDL symptoms Continue Trazodone 50 mg PO QHS PRN for insomnia Will recheck LFTs in AM  Craige Cotta, MD 08/26/2018, 4:35 PMPatient ID: Janna Arch, male   DOB: 1986/02/07, 33 y.o.   MRN: [id number]

## 2018-08-26 NOTE — Progress Notes (Signed)
Patient ID: [name], male   DOB: 07/07/1985, 33 y.o.   MRN: [id number]  [place] NOVEL CORONAVIRUS (COVID-19) DAILY CHECK-OFF SYMPTOMS - answer yes or no to each - every day NO YES  Have you had a fever in the past 24 hours?  . Fever (Temp > 37.80C / 100F) X   Have you had any of these symptoms in the past 24 hours? . New Cough .  Sore Throat  .  Shortness of Breath .  Difficulty Breathing .  Unexplained Body Aches   X   Have you had any one of these symptoms in the past 24 hours not related to allergies?   . Runny Nose .  Nasal Congestion .  Sneezing   X   If you have had runny nose, nasal congestion, sneezing in the past 24 hours, has it worsened?  X   EXPOSURES - check yes or no X   Have you traveled outside the state in the past 14 days?  X   Have you been in contact with someone with a confirmed diagnosis of COVID-19 or PUI in the past 14 days without wearing appropriate PPE?  X   Have you been living in the same home as a person with confirmed diagnosis of COVID-19 or a PUI (household contact)?    X   Have you been diagnosed with COVID-19?    X              What to do next: Answered NO to all: Answered YES to anything:   Proceed with unit schedule Follow the [facility] Inpatient Flowsheet.    

## 2018-08-27 LAB — HEPATIC FUNCTION PANEL
ALT: 127 U/L — ABNORMAL HIGH (ref 0–44)
AST: 122 U/L — ABNORMAL HIGH (ref 15–41)
Albumin: 4 g/dL (ref 3.5–5.0)
Alkaline Phosphatase: 64 U/L (ref 38–126)
Bilirubin, Direct: 0.2 mg/dL (ref 0.0–0.2)
Indirect Bilirubin: 0.7 mg/dL (ref 0.3–0.9)
Total Bilirubin: 0.9 mg/dL (ref 0.3–1.2)
Total Protein: 7.3 g/dL (ref 6.5–8.1)

## 2018-08-27 MED ORDER — ACAMPROSATE CALCIUM 333 MG PO TBEC: 666.0000 mg | DELAYED_RELEASE_TABLET | Freq: Three times a day (TID) | ORAL | 0 refills | Status: DC

## 2018-08-27 MED ORDER — NICOTINE 21 MG/24HR TD PT24: 21.0000 mg | MEDICATED_PATCH | Freq: Every day | TRANSDERMAL | 0 refills | Status: DC

## 2018-08-27 NOTE — Progress Notes (Signed)
Recreation Therapy Notes  Date:  5.15.20 Time: 0930 Location: [address] Dayroom  Group Topic: Stress Management  Goal Area(s) Addresses:  Patient will identify positive stress management techniques. Patient will identify benefits of using stress management post d/c.  Behavioral Response:  Engaged  Intervention: Stress Management  Activity :  Guided Imagery.  LRT introduced the stress management technique of guided imagery.  LRT read a script that lead patients on a walk along the beach.  Patients were to listen and follow along to engage in activity.  Education:  Stress Management, Discharge Planning.   Education Outcome: Acknowledges Education  Clinical Observations/Feedback: Pt attended and participated in group.    Caroll Rancher, LRT/CTRS         Caroll Rancher A 08/27/2018 11:44 AM

## 2018-08-27 NOTE — Discharge Summary (Addendum)
Physician Discharge Summary Note  Patient:  [name] is an 33 y.o., male MRN:  [id number] DOB:  1985/12/08 Patient phone:  [phone] (home)  Patient address:   [address] [place] [address],  Total Time spent with patient: 15 minutes  Date of Admission:  08/24/2018 Date of Discharge: 08/27/18  Reason for Admission:  Attempted to hang self while intoxicated  Principal Problem: MDD (major depressive disorder), recurrent severe, without psychosis (HCC) Discharge Diagnoses: Principal Problem:   MDD (major depressive disorder), recurrent severe, without psychosis (HCC) Active Problems:   Alcohol use disorder, severe, dependence (HCC)   Past Psychiatric History: Per admission H&P: MDD, no previous suicide attempts, previous hospitalization at 33 years old  Past Medical History:  Past Medical History:  Diagnosis Date  . Anxiety   . OCD (obsessive compulsive disorder)    History reviewed. No pertinent surgical history. Family History: History reviewed. No pertinent family history. Family Psychiatric  History: Per admission H&P: Father- committed suicide, TBI Social History:  Social History   Substance and Sexual Activity  Alcohol Use Yes     Social History   Substance and Sexual Activity  Drug Use Not Currently    Social History   Socioeconomic History  . Marital status: Single    Spouse name: Not on file  . Number of children: Not on file  . Years of education: Not on file  . Highest education level: Not on file  Occupational History  . Not on file  Social Needs  . Financial resource strain: Not on file  . Food insecurity:    Worry: Not on file    Inability: Not on file  . Transportation needs:    Medical: Not on file    Non-medical: Not on file  Tobacco Use  . Smoking status: Current Every Day Smoker    Packs/day: 1.00    Types: Cigarettes  . Smokeless tobacco: Never Used  Substance and Sexual Activity  . Alcohol use: Yes  . Drug  use: Not Currently  . Sexual activity: Not on file  Lifestyle  . Physical activity:    Days per week: Not on file    Minutes per session: Not on file  . Stress: Not on file  Relationships  . Social connections:    Talks on phone: Not on file    Gets together: Not on file    Attends religious service: Not on file    Active member of club or organization: Not on file    Attends meetings of clubs or organizations: Not on file    Relationship status: Not on file  Other Topics Concern  . Not on file  Social History Narrative  . Not on file    Hospital Course:  From admission H&P: Patient is a 33 year old male that was brought into [facility], ED under IVC after attempting to hang himself.  Patient presented to the ED with a BAL of 409.  Patient's 17 year old son intervened of the hanging.  Patient reports that his son's mother left him about 2 years ago and has taken that long to get over her.  He states that for the last year he has been increasing with his consumption of alcohol.  He states that he currently drinks approximately 6-12 beers a day or a pint of vodka.  He reports that he is actually been happy and doing well here recently and now has a new girlfriend.  He states that he has not really been depressed  and that the actions from last night were due to his intoxication.  He states he does want to quit drinking and he was unaware that his liver enzymes are elevated.  Patient's AST and ALT were both elevated in the lab work.  He is currently denying any suicidal or homicidal ideations and denies any hallucinations.  Patient currently appears to be having some withdrawal symptoms and is already having some tremors.  Patient is already on a CIWA scale for Ativan.  Patient states that he just wants to get through this so he can get back home.  Patient does report that he was going to [facility] in the past but feels that the medications they were given to him were not helping and the only one that  he remembers was a "antihistamine that helped with my anxiety."  Suspect that this medication was hydroxyzine.  Patient states that he does not feel that he needs to be started on any antidepressants at this time and just wants to stop drinking and get the detox over with.  [name] was admitted for suicidal ideation in the context of alcohol abuse. Admission BAL 409. Ativan CIWA protocol was started. Campral was started for alcohol cravings. He participated in group therapy on the unit. He remained on the [facility] unit for 3 days. He stabilized with medication and therapy. He was discharged on the medications listed below. He has shown improvement with improved mood, affect, sleep, appetite, and interaction. He denies any SI/HI/AVH and contracts for safety. He agrees to follow up at [facility] and [employer] (see below). He is provided with prescriptions for medications upon discharge. His mother is picking him up for discharge home.  Physical Findings: AIMS: Facial and Oral Movements Muscles of Facial Expression: None, normal Lips and Perioral Area: None, normal Jaw: None, normal Tongue: None, normal,Extremity Movements Upper (arms, wrists, hands, fingers): None, normal Lower (legs, knees, ankles, toes): None, normal, Trunk Movements Neck, shoulders, hips: None, normal, Overall Severity Severity of abnormal movements (highest score from questions above): None, normal Incapacitation due to abnormal movements: None, normal Patient's awareness of abnormal movements (rate only patient's report): No Awareness, Dental Status Current problems with teeth and/or dentures?: No Does patient usually wear dentures?: No  CIWA:  CIWA-Ar Total: 0 COWS:     Musculoskeletal: Strength & Muscle Tone: within normal limits Gait & Station: normal Patient leans: N/A  Psychiatric Specialty Exam: Physical Exam  Nursing note and vitals reviewed. Constitutional: He is oriented to person, place, and time.  He appears well-developed and well-nourished.  Cardiovascular: Normal rate.  Respiratory: Effort normal.  Neurological: He is alert and oriented to person, place, and time.    Review of Systems  Constitutional: Negative.   Respiratory: Negative for cough and shortness of breath.   Cardiovascular: Negative for chest pain.  Gastrointestinal: Negative for nausea and vomiting.  Neurological: Negative for tremors and headaches.  Psychiatric/Behavioral: Positive for substance abuse (ETOH). Negative for depression, hallucinations and suicidal ideas. The patient is not nervous/anxious and does not have insomnia.     Blood pressure (!) 125/108, pulse 92, temperature 98.6 F (37 C), temperature source Oral, resp. rate 20, height  (1.854 m), weight 80.3 kg, SpO2 99 %.Body mass index is 23.35 kg/m.  General Appearance: Casual  Eye Contact:  Good  Speech:  Clear and Coherent and Normal Rate  Volume:  Normal  Mood:  Euthymic  Affect:  Appropriate and Congruent  Thought Process:  Coherent and Goal Directed  Orientation:  Full (Time, Place, and Person)  Thought Content:  WDL  Suicidal Thoughts:  No  Homicidal Thoughts:  No  Memory:  Immediate;   Fair Recent;   Fair  Judgement:  Intact  Insight:  Fair  Psychomotor Activity:  Normal  Concentration:  Concentration: Good and Attention Span: Good  Recall:  Good  Fund of Knowledge:  Fair  Language:  Good  Akathisia:  No  Handed:  Right  AIMS (if indicated):     Assets:  Communication Skills Desire for Improvement Housing Social Support  ADL's:  Intact  Cognition:  WNL  Sleep:  Number of Hours: 6     Have you used any form of tobacco in the last 30 days? (Cigarettes, Smokeless Tobacco, Cigars, and/or Pipes): Yes  Has this patient used any form of tobacco in the last 30 days? (Cigarettes, Smokeless Tobacco, Cigars, and/or Pipes) Yes, a prescription for an FDA-approved medication for tobacco cessation was offered at discharge.   Blood  Alcohol level:  Lab Results  Component Value Date   ETH 409 [facility]) 08/24/2018    Metabolic Disorder Labs:  Lab Results  Component Value Date   HGBA1C 4.9 08/25/2018   MPG 93.93 08/25/2018   No results found for: PROLACTIN Lab Results  Component Value Date   CHOL 207 (H) 08/25/2018   TRIG 20 08/25/2018   HDL >135 08/25/2018   CHOLHDL NOT CALCULATED 08/25/2018   VLDL 4 08/25/2018   LDLCALC NOT CALCULATED 08/25/2018    See Psychiatric Specialty Exam and Suicide Risk Assessment completed by Attending Physician prior to discharge.  Discharge destination:  Home  Is patient on multiple antipsychotic therapies at discharge:  No   Has Patient had three or more failed trials of antipsychotic monotherapy by history:  No  Recommended Plan for Multiple Antipsychotic Therapies: NA  Discharge Instructions    Discharge instructions   Complete by:  As directed    Patient is instructed to take all prescribed medications as recommended. Report any side effects or adverse reactions to your outpatient psychiatrist. Patient is instructed to abstain from alcohol and illegal drugs while on prescription medications. In the event of worsening symptoms, patient is instructed to call the crisis hotline, 911, or go to the nearest emergency department for evaluation and treatment.     Allergies as of 08/27/2018   No Known Allergies     Medication List    STOP taking these medications   ibuprofen 400 MG tablet Commonly known as:  ADVIL     TAKE these medications     Indication  acamprosate 333 MG tablet Commonly known as:  CAMPRAL Take 2 tablets (666 mg total) by mouth 3 (three) times daily with meals.  Indication:  Abuse or Misuse of Alcohol   nicotine 21 mg/24hr patch Commonly known as:  NICODERM CQ - dosed in mg/24 hours Place 1 patch (21 mg total) onto the skin daily. Start taking on:  Aug 28, 2018  Indication:  Nicotine Addiction      Follow-up Information    [facility]. Go on  08/31/2018.   Why:  You will receive a phone call on 08/31/2018 at 10am. It will be via phone call for your hospital discharge appointment with [facility].  Contact information: [address] [place] [address] [phone]        [facility] Follow up on 09/03/2018.   Why:  Your new patient appointment will be held via phone on 09/03/18 at 9:30am.  Please have your hospital discharge paperwork accessible.  Contact information: [address] [place] [phone]          Follow-up recommendations: Activity as tolerated. Diet as recommended by primary care physician. Keep all scheduled follow-up appointments as recommended.   Comments:   Patient is instructed to take all prescribed medications as recommended. Report any side effects or adverse reactions to your outpatient psychiatrist. Patient is instructed to abstain from alcohol and illegal drugs while on prescription medications. In the event of worsening symptoms, patient is instructed to call the crisis hotline, 911, or go to the nearest emergency department for evaluation and treatment.  Signed: Aldean BakerJanet E Sykes, NP 08/27/2018, 10:28 AM   Patient seen, Suicide Assessment Completed.  Disposition Plan Reviewed

## 2018-08-27 NOTE — Progress Notes (Signed)
This RN did teaching with [name] this morning about Campral as this is his second day of taking this medication and he did not remember any teaching about this medication yesterday. He decided to refuse his dose for this morning because he thinks he may be discharged and wants to have a couple drinks when he gets home. This RN educated [name] of the risks of even a couple drinks.

## 2018-08-27 NOTE — BHH Suicide Risk Assessment (Signed)
[facility] Discharge Suicide Risk Assessment   Principal Problem: MDD (major depressive disorder), recurrent severe, without psychosis (HCC) Discharge Diagnoses: Principal Problem:   MDD (major depressive disorder), recurrent severe, without psychosis (HCC) Active Problems:   Alcohol use disorder, severe, dependence (HCC)   Total Time spent with patient: 30 minutes  Musculoskeletal: Strength & Muscle Tone: within normal limits-minimal distal tremors at this time.  Gait & Station: normal Patient leans: N/A  Psychiatric Specialty Exam: ROS denies headache, no chest pain, no visual disturbances, no shortness of breath, no nausea, no vomiting, no fever, no chills  Blood pressure (!) 125/108, pulse 92, temperature 98.6 F (37 C), temperature source Oral, resp. rate 20, height 6\' 1"  (1.854 m), weight 80.3 kg, SpO2 99 %.Body mass index is 23.35 kg/m.  General Appearance: Well Groomed  Eye Contact::  Good  Speech:  Normal Rate409  Volume:  Normal  Mood:  Reports mood is improved, denies feeling depressed, states "I feel all right"  Affect:  Reactive, vaguely anxious  Thought Process:  Linear and Descriptions of Associations: Intact  Orientation:  Full (Time, Place, and Person)  Thought Content:  No hallucinations, no delusions  Suicidal Thoughts:  No denies suicidal or self-injurious ideations, denies homicidal or violent ideations  Homicidal Thoughts:  No  Memory:  Recent and remote grossly intact  Judgement:  Other:  Fair/improving  Insight:  Fair  Psychomotor Activity:  No restlessness, decreased/improved distal tremors  Concentration:  Good  Recall:  Good  Fund of Knowledge:Good  Language: Good  Akathisia:  Negative  Handed:  Right  AIMS (if indicated):     Assets:  Communication Skills Desire for Improvement Resilience  Sleep:  Number of Hours: 6  Cognition: WNL  ADL's:  Intact   Mental Status Per Nursing Assessment::   On Admission:  Suicidal ideation indicated by patient,  Suicide plan, Plan includes specific time, place, or method, Self-harm thoughts, Self-harm behaviors, Intention to act on suicide plan  Demographic Factors:  33 year old male, single, has a 42 year old son, currently unemployed, lives with his mother and with his son.  Loss Factors: Reports family tension regarding his mother not wanting him to leave (states he was planning on going to live with his girlfriend)  Historical Factors: Prior psychiatric admission as a teenager for depression, denies history of prior suicide attempts, history of alcohol use disorder  Risk Reduction Factors:   Responsible for children under 59 years of age, Sense of responsibility to family, Living with another person, especially a relative and Positive coping skills or problem solving skills  Continued Clinical Symptoms:  At this time patient presents alert, attentive, denies depression and states "my mood is all right", affect is reactive, vaguely anxious.  There is no thought disorder.  He denies any suicidal or self-injurious ideations, he denies any homicidal or violent ideations.  No hallucinations, no delusions.  Currently he is future oriented-states "I am really looking forward to spending time with my son, my girlfriend playing guitar". Patient attributes improved mood in part to finding out that his girlfriend will be able to move in with him and his mother.  With his expressed consent I have spoken with his mother via phone-mother states that patient seems much improved and at baseline and is in agreement with discharge today.  States she will pick him up. Currently denies cravings for alcohol.  We have reviewed side effect profile and indication for Campral.  I have encouraged patient to abstain from alcohol and to consider further efforts  to maintain abstinence/sobriety.  Patient is not currently on any standing psychiatric medications other than Campral.  Blood pressure remains elevated-patient  attributes it to" being in the hospital " and to caffeine.  We also reviewed AST/ALT elevation (today 122 and 127 respectively) .  He  denies any associated symptoms Agrees to follow-up with PCP for further management and treatment if needed.   Cognitive Features That Contribute To Risk:  No gross cognitive deficits noted upon discharge. Is alert , attentive, and oriented x 3   Suicide Risk:  Mild  Follow-up Information    [facility]. Go on 08/31/2018.   Why:  You will receive a phone call on 08/31/2018 at 10am. It will be via phone call for your hospital [facility].  Contact information: [address] [place] [phone]        [facility] Follow up on 09/03/2018.   Why:  Your new patient appointment will be held via phone on 09/03/18 at 9:30am. Please have your hospital discharge paperwork accessible.  Contact information: [address] [place] [phone]          Plan Of Care/Follow-up recommendations:  Activity:  As tolerated Diet:  Regular Tests:  NA Other:  See below  Patient is expressing readiness for discharge.  No grounds for further involuntary commitment at this time.  Leaving in good spirits, plans to return home, states his mother will pick him up later today.  Follow-up as above.  [facility] for medical management as needed.  Encouraged to abstain from alcohol, avoid people, places, situations associated with alcohol consumption, and encouraged to consider 12-step program participation.   Craige CottaFernando A Ethelle Ola, MD 08/27/2018, 10:33 AM

## 2018-08-27 NOTE — Progress Notes (Signed)
  [facility] Adult Case Management Discharge Plan :  Will you be returning to the same living situation after discharge:  Yes,  home At discharge, do you have transportation home?: Yes,  mom is picking up Do you have the ability to pay for your medications: No. Referred to [facility].  Release of information consent forms completed and in the chart. Patient to Follow up at: Follow-up Information    [facility]. Go on 08/31/2018.   Why:  You will receive a phone call on 08/31/2018 at 10am. It will be via phone call for your hospital discharge appointment with [facility].  Contact information: [address] [place] [address] [phone]        [facility] Follow up on 09/03/2018.   Why:  Your new patient appointment will be held via phone on 09/03/18 at 9:30am. Please have your hospital discharge paperwork accessible.  Contact information: [address] [place] [address] [phone]          Next level of care provider has access to [facility] Link:yes  Safety Planning and Suicide Prevention discussed: Yes,  with mom  Have you used any form of tobacco in the last 30 days? (Cigarettes, Smokeless Tobacco, Cigars, and/or Pipes): Yes  Has patient been referred to the Quitline?: Patient refused referral  Patient has been referred for addiction treatment: Yes  Darreld Mclean, LCSWA 08/27/2018, 9:44 AM

## 2018-08-27 NOTE — Progress Notes (Signed)
Pt's blood pressure is elevated. Explained risk factors associated with high blood pressure and pt refused advice. Pt is fixated on leaving and not invested in treatment. Reported elevated blood pressure to NP and advised not to give medication at discharge due to not being able to follow up within the hour. Pt refused to wait and take medication. All items returned. D/C instructions given and suicide information given. Pt denies si and hi.

## 2018-09-03 ENCOUNTER — Inpatient Hospital Stay: Payer: Self-pay | Admitting: Family Medicine

## 2018-10-21 ENCOUNTER — Emergency Department (HOSPITAL_COMMUNITY): Payer: Self-pay

## 2018-10-21 ENCOUNTER — Inpatient Hospital Stay (HOSPITAL_COMMUNITY)
Admission: AD | Admit: 2018-10-21 | Discharge: 2018-10-24 | DRG: 897 | Disposition: A | Discharge status: Home / Self Care | Payer: Federal, State, Local not specified - Other | Attending: Psychiatry | Admitting: Psychiatry

## 2018-10-21 ENCOUNTER — Other Ambulatory Visit: Payer: Self-pay

## 2018-10-21 ENCOUNTER — Encounter (HOSPITAL_COMMUNITY): Payer: Self-pay

## 2018-10-21 ENCOUNTER — Emergency Department (HOSPITAL_COMMUNITY)
Admission: EM | Admit: 2018-10-21 | Discharge: 2018-10-21 | Disposition: A | Discharge status: Home / Self Care | Payer: Self-pay | Attending: Emergency Medicine | Admitting: Emergency Medicine

## 2018-10-21 DIAGNOSIS — F1020 Alcohol dependence, uncomplicated: Secondary | ICD-10-CM | POA: Insufficient documentation

## 2018-10-21 DIAGNOSIS — W19XXXA Unspecified fall, initial encounter: Secondary | ICD-10-CM | POA: Insufficient documentation

## 2018-10-21 DIAGNOSIS — Z915 Personal history of self-harm: Secondary | ICD-10-CM

## 2018-10-21 DIAGNOSIS — Z046 Encounter for general psychiatric examination, requested by authority: Secondary | ICD-10-CM

## 2018-10-21 DIAGNOSIS — F1024 Alcohol dependence with alcohol-induced mood disorder: Secondary | ICD-10-CM | POA: Diagnosis present

## 2018-10-21 DIAGNOSIS — Y908 Blood alcohol level of 240 mg/100 ml or more: Secondary | ICD-10-CM | POA: Insufficient documentation

## 2018-10-21 DIAGNOSIS — Z03818 Encounter for observation for suspected exposure to other biological agents ruled out: Secondary | ICD-10-CM | POA: Insufficient documentation

## 2018-10-21 DIAGNOSIS — T148XXA Other injury of unspecified body region, initial encounter: Secondary | ICD-10-CM

## 2018-10-21 DIAGNOSIS — F17210 Nicotine dependence, cigarettes, uncomplicated: Secondary | ICD-10-CM | POA: Insufficient documentation

## 2018-10-21 DIAGNOSIS — F1014 Alcohol abuse with alcohol-induced mood disorder: Secondary | ICD-10-CM | POA: Diagnosis not present

## 2018-10-21 DIAGNOSIS — Z23 Encounter for immunization: Secondary | ICD-10-CM | POA: Insufficient documentation

## 2018-10-21 DIAGNOSIS — F332 Major depressive disorder, recurrent severe without psychotic features: Secondary | ICD-10-CM | POA: Insufficient documentation

## 2018-10-21 DIAGNOSIS — Y929 Unspecified place or not applicable: Secondary | ICD-10-CM | POA: Insufficient documentation

## 2018-10-21 DIAGNOSIS — S0081XA Abrasion of other part of head, initial encounter: Secondary | ICD-10-CM | POA: Insufficient documentation

## 2018-10-21 DIAGNOSIS — S0031XA Abrasion of nose, initial encounter: Secondary | ICD-10-CM | POA: Insufficient documentation

## 2018-10-21 DIAGNOSIS — Y939 Activity, unspecified: Secondary | ICD-10-CM | POA: Insufficient documentation

## 2018-10-21 DIAGNOSIS — Y999 Unspecified external cause status: Secondary | ICD-10-CM | POA: Insufficient documentation

## 2018-10-21 DIAGNOSIS — Z818 Family history of other mental and behavioral disorders: Secondary | ICD-10-CM

## 2018-10-21 DIAGNOSIS — F10920 Alcohol use, unspecified with intoxication, uncomplicated: Secondary | ICD-10-CM

## 2018-10-21 LAB — CBC
HCT: 45.9 % (ref 39.0–52.0)
Hemoglobin: 15.9 g/dL (ref 13.0–17.0)
MCH: 37 pg — ABNORMAL HIGH (ref 26.0–34.0)
MCHC: 34.6 g/dL (ref 30.0–36.0)
MCV: 106.7 fL — ABNORMAL HIGH (ref 80.0–100.0)
Platelets: 182 10*3/uL (ref 150–400)
RBC: 4.3 MIL/uL (ref 4.22–5.81)
RDW: 11.2 % — ABNORMAL LOW (ref 11.5–15.5)
WBC: 5.9 10*3/uL (ref 4.0–10.5)
nRBC: 0 % (ref 0.0–0.2)

## 2018-10-21 LAB — COMPREHENSIVE METABOLIC PANEL
ALT: 21 U/L (ref 0–44)
AST: 29 U/L (ref 15–41)
Albumin: 4.1 g/dL (ref 3.5–5.0)
Alkaline Phosphatase: 55 U/L (ref 38–126)
Anion gap: 10 (ref 5–15)
BUN: 8 mg/dL (ref 6–20)
CO2: 26 mmol/L (ref 22–32)
Calcium: 8.5 mg/dL — ABNORMAL LOW (ref 8.9–10.3)
Chloride: 110 mmol/L (ref 98–111)
Creatinine, Ser: 0.65 mg/dL (ref 0.61–1.24)
GFR calc Af Amer: >60 mL/min (ref >60)
GFR calc non Af Amer: >60 mL/min (ref >60)
Glucose, Bld: 99 mg/dL (ref 70–99)
Potassium: 4.2 mmol/L (ref 3.5–5.1)
Sodium: 146 mmol/L — ABNORMAL HIGH (ref 135–145)
Total Bilirubin: 0.2 mg/dL — ABNORMAL LOW (ref 0.3–1.2)
Total Protein: 7.2 g/dL (ref 6.5–8.1)

## 2018-10-21 LAB — SALICYLATE LEVEL: Salicylate Lvl: <7 mg/dL (ref 2.8–30.0)

## 2018-10-21 LAB — ACETAMINOPHEN LEVEL: Acetaminophen (Tylenol), Serum: <10 ug/mL — ABNORMAL LOW (ref 10–30)

## 2018-10-21 LAB — RAPID URINE DRUG SCREEN, HOSP PERFORMED
Amphetamines: NOT DETECTED
Barbiturates: NOT DETECTED
Benzodiazepines: POSITIVE — AB
Cocaine: NOT DETECTED
Opiates: NOT DETECTED
Tetrahydrocannabinol: POSITIVE — AB

## 2018-10-21 LAB — SARS CORONAVIRUS 2 BY RT PCR (HOSPITAL ORDER, PERFORMED IN ~~LOC~~ HOSPITAL LAB): SARS Coronavirus 2: NEGATIVE

## 2018-10-21 LAB — ETHANOL: Alcohol, Ethyl (B): 329 mg/dL (ref <10)

## 2018-10-21 MED ORDER — ALUM & MAG HYDROXIDE-SIMETH 200-200-20 MG/5ML PO SUSP: 30.0000 mL | Freq: Four times a day (QID) | ORAL | Status: DC | PRN

## 2018-10-21 MED ORDER — TRAZODONE HCL 50 MG PO TABS
50.0000 mg | ORAL_TABLET | Freq: Every evening | ORAL | Status: DC | PRN
  Administered 2018-10-21 – 2018-10-23 (×3): 50 mg via ORAL
  Filled 2018-10-21 (×2): qty 1

## 2018-10-21 MED ORDER — MAGNESIUM HYDROXIDE 400 MG/5ML PO SUSP: 30.0000 mL | Freq: Every day | ORAL | Status: DC | PRN

## 2018-10-21 MED ORDER — ACETAMINOPHEN 325 MG PO TABS: 650.0000 mg | ORAL_TABLET | Freq: Four times a day (QID) | ORAL | Status: DC | PRN

## 2018-10-21 MED ORDER — LORAZEPAM 2 MG/ML IJ SOLN: 0.0000 mg | Freq: Four times a day (QID) | INTRAMUSCULAR | Status: DC

## 2018-10-21 MED ORDER — VITAMIN B-1 100 MG PO TABS: 100.0000 mg | ORAL_TABLET | Freq: Every day | ORAL | Status: DC

## 2018-10-21 MED ORDER — CHLORDIAZEPOXIDE HCL 25 MG PO CAPS
25.0000 mg | ORAL_CAPSULE | ORAL | Status: DC
  Administered 2018-10-24: 25 mg via ORAL
  Filled 2018-10-21: qty 1

## 2018-10-21 MED ORDER — VITAMIN B-1 100 MG PO TABS
100.0000 mg | ORAL_TABLET | Freq: Every day | ORAL | Status: DC
  Administered 2018-10-22 – 2018-10-24 (×2): 100 mg via ORAL
  Filled 2018-10-21 (×6): qty 1

## 2018-10-21 MED ORDER — TETANUS-DIPHTH-ACELL PERTUSSIS 5-2.5-18.5 LF-MCG/0.5 IM SUSP
0.5000 mL | Freq: Once | INTRAMUSCULAR | Status: AC
  Administered 2018-10-21: 0.5 mL via INTRAMUSCULAR
  Filled 2018-10-21: qty 0.5

## 2018-10-21 MED ORDER — HYDROXYZINE HCL 25 MG PO TABS
25.0000 mg | ORAL_TABLET | Freq: Three times a day (TID) | ORAL | Status: DC | PRN
  Administered 2018-10-21 – 2018-10-23 (×3): 25 mg via ORAL
  Filled 2018-10-21 (×3): qty 1

## 2018-10-21 MED ORDER — LORAZEPAM 1 MG PO TABS: 0.0000 mg | ORAL_TABLET | Freq: Two times a day (BID) | ORAL | Status: DC

## 2018-10-21 MED ORDER — ONDANSETRON HCL 4 MG PO TABS: 4.0000 mg | ORAL_TABLET | Freq: Three times a day (TID) | ORAL | Status: DC | PRN

## 2018-10-21 MED ORDER — CHLORDIAZEPOXIDE HCL 25 MG PO CAPS: 25.0000 mg | ORAL_CAPSULE | Freq: Four times a day (QID) | ORAL | Status: DC | PRN

## 2018-10-21 MED ORDER — CHLORDIAZEPOXIDE HCL 25 MG PO CAPS
25.0000 mg | ORAL_CAPSULE | Freq: Three times a day (TID) | ORAL | Status: AC
  Administered 2018-10-23 (×3): 25 mg via ORAL
  Filled 2018-10-21 (×3): qty 1

## 2018-10-21 MED ORDER — ONDANSETRON HCL 4 MG PO TABS
4.0000 mg | ORAL_TABLET | Freq: Three times a day (TID) | ORAL | Status: DC | PRN
Start: 2018-10-21 — End: 2018-10-25

## 2018-10-21 MED ORDER — ACETAMINOPHEN 325 MG PO TABS: 650.0000 mg | ORAL_TABLET | ORAL | Status: DC | PRN

## 2018-10-21 MED ORDER — LORAZEPAM 2 MG/ML IJ SOLN: 0.0000 mg | Freq: Two times a day (BID) | INTRAMUSCULAR | Status: DC

## 2018-10-21 MED ORDER — LORAZEPAM 1 MG PO TABS: 0.0000 mg | ORAL_TABLET | Freq: Four times a day (QID) | ORAL | Status: DC

## 2018-10-21 MED ORDER — ALUM & MAG HYDROXIDE-SIMETH 200-200-20 MG/5ML PO SUSP: 30.0000 mL | ORAL | Status: DC | PRN

## 2018-10-21 MED ORDER — THIAMINE HCL 100 MG/ML IJ SOLN: 100.0000 mg | Freq: Every day | INTRAMUSCULAR | Status: DC

## 2018-10-21 MED ORDER — CHLORDIAZEPOXIDE HCL 25 MG PO CAPS: 25.0000 mg | ORAL_CAPSULE | Freq: Every day | ORAL | Status: DC

## 2018-10-21 MED ORDER — CHLORDIAZEPOXIDE HCL 25 MG PO CAPS
25.0000 mg | ORAL_CAPSULE | Freq: Four times a day (QID) | ORAL | Status: AC
  Administered 2018-10-21: 17:00:00 25 mg via ORAL
  Filled 2018-10-21 (×2): qty 1

## 2018-10-21 MED ORDER — NICOTINE POLACRILEX 2 MG MT GUM
2.0000 mg | CHEWING_GUM | OROMUCOSAL | Status: DC | PRN
  Administered 2018-10-21: 2 mg via ORAL

## 2018-10-21 MED ORDER — TRAZODONE HCL 50 MG PO TABS
50.0000 mg | ORAL_TABLET | Freq: Once | ORAL | Status: DC
  Filled 2018-10-21: qty 1

## 2018-10-21 NOTE — ED Notes (Signed)
Patient refused CT scans twice.

## 2018-10-21 NOTE — Progress Notes (Signed)
Pt accepted to [facility], Bed 303-1  Neita Garnet is the accepting/attending provider.  Call report to [phone]  Tom [email or web address]@[facility]  ED notified.   Pt is IVC  Pt may be transported by [employer] Pt scheduled  to arrive at [facility] as soon as transport can be arranged.  Areatha Keas. Judi Cong, MSW, [place] Disposition Clinical Social Work [phone] (cell) [phone] (office)

## 2018-10-21 NOTE — ED Provider Notes (Signed)
[facility] DEPT Provider Note   CSN: [id number] Arrival date & time: 10/21/18  4008    History   Chief Complaint Chief Complaint  Patient presents with   intoxicated   IVC   Fall    HPI [name] is a 33 y.o. male history of anxiety, OCD, depression, alcohol use disorder presents under IVC for evaluation of suicidal ideations.  IVC paperwork was taken out by his mother and states that the patient has not been taking his medications and has been abusing alcohol.  Also states that earlier this evening he verbalized her "today is the day that I will" and she is concerned that he may follow through with a suicidal act.  Patient appears intoxicated on my assessment, tells me that he had "four beers" to drink.  He has several abrasions to his forehead and nose and states that he sustained these when opening a door to a vehicle and accidentally striking himself on the head.  Denies loss of consciousness or neck pain.  He denies any suicidal ideation or homicidal ideation to me but states that he is very sad after breaking up with his girlfriend today and "just want to see my kid".     The history is provided by the patient.    Past Medical History:  Diagnosis Date   Anxiety    OCD (obsessive compulsive disorder)     Patient Active Problem List   Diagnosis Date Noted   MDD (major depressive disorder), recurrent severe, without psychosis ([place]) 08/24/2018   Alcohol use disorder, severe, dependence ([place]) 08/24/2018    History reviewed. No pertinent surgical history.      Home Medications    Prior to Admission medications   Medication Sig Start Date End Date Taking? Authorizing Provider  acamprosate (CAMPRAL) 333 MG tablet Take 2 tablets (666 mg total) by mouth 3 (three) times daily with meals. Patient not taking: Reported on 10/21/2018 08/27/18   Connye Burkitt, NP  nicotine (NICODERM CQ - DOSED IN MG/24 HOURS) 21 mg/24hr patch Place 1  patch (21 mg total) onto the skin daily. Patient not taking: Reported on 10/21/2018 08/28/18   Connye Burkitt, NP    Family History History reviewed. No pertinent family history.  Social History Social History   Tobacco Use   Smoking status: Current Every Day Smoker    Packs/day: 1.00    Types: Cigarettes   Smokeless tobacco: Never Used  Substance Use Topics   Alcohol use: Yes   Drug use: Not Currently     Allergies   Patient has no known allergies.   Review of Systems Review of Systems  Constitutional: Negative for chills and fever.  Respiratory: Negative for shortness of breath.   Cardiovascular: Negative for chest pain.  Gastrointestinal: Negative for abdominal pain, nausea and vomiting.  Skin: Positive for wound.  Neurological: Negative for syncope and headaches.  Psychiatric/Behavioral: Positive for dysphoric mood.  All other systems reviewed and are negative.    Physical Exam Updated Vital Signs BP 107/73 (BP Location: Left Arm)    Pulse (!) 56    Temp 97.7 F (36.5 C)    Resp 16    Ht 6' (1.829 m)    Wt 72.6 kg    SpO2 100%    BMI 21.70 kg/m   Physical Exam Vitals signs and nursing note reviewed.  Constitutional:      General: He is not in acute distress.    Appearance: He is well-developed.  HENT:     Head: Normocephalic.     Comments: Superficial abrasions to the forehead and bridge of the nose. No Battle's signs, no raccoon's eyes, no rhinorrhea. No hemotympanum. No tenderness to palpation of the face or skull. No deformity, crepitus, or swelling noted.  Dentition appears stable Eyes:     General:        Right eye: No discharge.        Left eye: No discharge.     Conjunctiva/sclera: Conjunctivae normal.  Neck:     Musculoskeletal: Normal range of motion and neck supple.     Vascular: No JVD.     Trachea: No tracheal deviation.     Comments: No midline cervical spine tenderness, no paracervical muscle tenderness.  No deformity, crepitus, or  step-off noted Cardiovascular:     Rate and Rhythm: Normal rate.     Pulses: Normal pulses.  Pulmonary:     Effort: Pulmonary effort is normal.     Comments: Speaking in full sentences without difficulty, SPO2 saturations 98% on room air Chest:     Chest wall: No tenderness.  Abdominal:     General: Bowel sounds are normal. There is no distension.     Palpations: Abdomen is soft.     Tenderness: There is no abdominal tenderness. There is no guarding or rebound.  Skin:    General: Skin is warm and dry.     Findings: No erythema.  Neurological:     Mental Status: He is alert.     Comments: Oriented to person and place but not time (day of the week). Mildly dysarthric speech.  Cranial nerves appear grossly intact.  Moves extremities spontaneously with intact strength.  Psychiatric:        Mood and Affect: Mood is depressed. Affect is tearful.        Speech: Speech is slurred.        Behavior: Behavior is cooperative.        Thought Content: Thought content does not include homicidal or suicidal plan.      ED Treatments / Results  Labs (all labs ordered are listed, but only abnormal results are displayed) Labs Reviewed  COMPREHENSIVE METABOLIC PANEL - Abnormal; Notable for the following components:      Result Value   Sodium 146 (*)    Calcium 8.5 (*)    Total Bilirubin 0.2 (*)    All other components within normal limits  ETHANOL - Abnormal; Notable for the following components:   Alcohol, Ethyl (B) 329 (*)    All other components within normal limits  CBC - Abnormal; Notable for the following components:   MCV 106.7 (*)    MCH 37.0 (*)    RDW 11.2 (*)    All other components within normal limits  RAPID URINE DRUG SCREEN, HOSP PERFORMED - Abnormal; Notable for the following components:   Benzodiazepines POSITIVE (*)    Tetrahydrocannabinol POSITIVE (*)    All other components within normal limits  ACETAMINOPHEN LEVEL - Abnormal; Notable for the following components:    Acetaminophen (Tylenol), Serum <10 (*)    All other components within normal limits  SARS CORONAVIRUS 2 (HOSPITAL ORDER, PERFORMED IN [facility])  SALICYLATE LEVEL    EKG None  Radiology Ct Head Wo Contrast  Result Date: 10/21/2018 CLINICAL DATA:  Intoxicated, 2 unwitnessed falls EXAM: CT HEAD WITHOUT CONTRAST CT MAXILLOFACIAL WITHOUT CONTRAST CT CERVICAL SPINE WITHOUT CONTRAST TECHNIQUE: Multidetector CT imaging of the head, cervical spine,  and maxillofacial structures were performed using the standard protocol without intravenous contrast. Multiplanar CT image reconstructions of the cervical spine and maxillofacial structures were also generated. Right side of face marked with BB. COMPARISON:  None FINDINGS: CT HEAD FINDINGS Brain: Normal ventricular morphology. No midline shift or mass effect. Mild cortical atrophy for age. No intracranial hemorrhage, mass lesion, or evidence of acute infarction. No extra-axial fluid collections. Vascular: No hyperdense vessels Skull: Calvaria intact Other: N/A CT MAXILLOFACIAL FINDINGS Osseous: Osseous mineralization normal. TMJ alignment normal. Nasal septal deviation to the RIGHT. No facial bone fractures identified. Orbits: Bony orbits intact. Intraorbital soft tissue planes clear without gas or fluid. Sinuses: Paranasal sinuses, mastoid air cells, and middle ear cavities clear bilaterally Soft tissues: Unremarkable CT CERVICAL SPINE FINDINGS Alignment: Normal Skull base and vertebrae: Skull base intact. Osseous mineralization normal. Vertebral body and disc space heights maintained. No fracture, subluxation or bone destruction. Soft tissues and spinal canal: Prevertebral soft tissues normal thickness. Remaining cervical soft tissues unremarkable Disc levels:  No significant abnormalities Upper chest: Tips of lung apices clear Other: N/A IMPRESSION: No acute intracranial abnormalities. Normal CT cervical spine. No acute facial bone abnormalities.  Electronically Signed   By: Ulyses SouthwardMark  Boles M.D.   On: 10/21/2018 09:57   Ct Cervical Spine Wo Contrast  Result Date: 10/21/2018 CLINICAL DATA:  Intoxicated, 2 unwitnessed falls EXAM: CT HEAD WITHOUT CONTRAST CT MAXILLOFACIAL WITHOUT CONTRAST CT CERVICAL SPINE WITHOUT CONTRAST TECHNIQUE: Multidetector CT imaging of the head, cervical spine, and maxillofacial structures were performed using the standard protocol without intravenous contrast. Multiplanar CT image reconstructions of the cervical spine and maxillofacial structures were also generated. Right side of face marked with BB. COMPARISON:  None FINDINGS: CT HEAD FINDINGS Brain: Normal ventricular morphology. No midline shift or mass effect. Mild cortical atrophy for age. No intracranial hemorrhage, mass lesion, or evidence of acute infarction. No extra-axial fluid collections. Vascular: No hyperdense vessels Skull: Calvaria intact Other: N/A CT MAXILLOFACIAL FINDINGS Osseous: Osseous mineralization normal. TMJ alignment normal. Nasal septal deviation to the RIGHT. No facial bone fractures identified. Orbits: Bony orbits intact. Intraorbital soft tissue planes clear without gas or fluid. Sinuses: Paranasal sinuses, mastoid air cells, and middle ear cavities clear bilaterally Soft tissues: Unremarkable CT CERVICAL SPINE FINDINGS Alignment: Normal Skull base and vertebrae: Skull base intact. Osseous mineralization normal. Vertebral body and disc space heights maintained. No fracture, subluxation or bone destruction. Soft tissues and spinal canal: Prevertebral soft tissues normal thickness. Remaining cervical soft tissues unremarkable Disc levels:  No significant abnormalities Upper chest: Tips of lung apices clear Other: N/A IMPRESSION: No acute intracranial abnormalities. Normal CT cervical spine. No acute facial bone abnormalities. Electronically Signed   By: Ulyses SouthwardMark  Boles M.D.   On: 10/21/2018 09:57   Ct Maxillofacial Wo Cm  Result Date: 10/21/2018 CLINICAL  DATA:  Intoxicated, 2 unwitnessed falls EXAM: CT HEAD WITHOUT CONTRAST CT MAXILLOFACIAL WITHOUT CONTRAST CT CERVICAL SPINE WITHOUT CONTRAST TECHNIQUE: Multidetector CT imaging of the head, cervical spine, and maxillofacial structures were performed using the standard protocol without intravenous contrast. Multiplanar CT image reconstructions of the cervical spine and maxillofacial structures were also generated. Right side of face marked with BB. COMPARISON:  None FINDINGS: CT HEAD FINDINGS Brain: Normal ventricular morphology. No midline shift or mass effect. Mild cortical atrophy for age. No intracranial hemorrhage, mass lesion, or evidence of acute infarction. No extra-axial fluid collections. Vascular: No hyperdense vessels Skull: Calvaria intact Other: N/A CT MAXILLOFACIAL FINDINGS Osseous: Osseous mineralization normal. TMJ alignment normal. Nasal  septal deviation to the RIGHT. No facial bone fractures identified. Orbits: Bony orbits intact. Intraorbital soft tissue planes clear without gas or fluid. Sinuses: Paranasal sinuses, mastoid air cells, and middle ear cavities clear bilaterally Soft tissues: Unremarkable CT CERVICAL SPINE FINDINGS Alignment: Normal Skull base and vertebrae: Skull base intact. Osseous mineralization normal. Vertebral body and disc space heights maintained. No fracture, subluxation or bone destruction. Soft tissues and spinal canal: Prevertebral soft tissues normal thickness. Remaining cervical soft tissues unremarkable Disc levels:  No significant abnormalities Upper chest: Tips of lung apices clear Other: N/A IMPRESSION: No acute intracranial abnormalities. Normal CT cervical spine. No acute facial bone abnormalities. Electronically Signed   By: Ulyses SouthwardMark  Boles M.D.   On: 10/21/2018 09:57    Procedures Procedures (including critical care time)  Medications Ordered in ED Medications  acetaminophen (TYLENOL) tablet 650 mg (has no administration in time range)  LORazepam (ATIVAN)  injection 0-4 mg (0 mg Intravenous Hold 10/21/18 1058)    Or  LORazepam (ATIVAN) tablet 0-4 mg ( Oral See Alternative 10/21/18 1058)  LORazepam (ATIVAN) injection 0-4 mg (has no administration in time range)    Or  LORazepam (ATIVAN) tablet 0-4 mg (has no administration in time range)  thiamine (VITAMIN B-1) tablet 100 mg (0 mg Oral Hold 10/21/18 1059)    Or  thiamine (B-1) injection 100 mg ( Intravenous See Alternative 10/21/18 1059)  ondansetron (ZOFRAN) tablet 4 mg (has no administration in time range)  alum & mag hydroxide-simeth (MAALOX/MYLANTA) 200-200-20 MG/5ML suspension 30 mL (has no administration in time range)  Tdap (BOOSTRIX) injection 0.5 mL (0.5 mLs Intramuscular Given 10/21/18 0746)     Initial Impression / Assessment and Plan / ED Course  I have reviewed the triage vital signs and the nursing notes.  Pertinent labs & imaging results that were available during my care of the patient were reviewed by me and considered in my medical decision making (see chart for details).        Patient presenting the [employer] under IVC for suicidal ideation.  He is afebrile, vital signs are stable.  He is nontoxic in appearance.  He is intoxicated on examination and exhibits dysarthric speech.  Has superficial abrasions to his forehead and nose after reportedly running into a car door.  Despite reassuring physical examination, he will require imaging given alcohol intoxication.   Imaging shows no acute intracranial abnormalities, no evidence of facial fractures, cervical spine injury, or foreign bodies.  Blood work reviewed by me shows no leukocytosis, no anemia, no metabolic derangements.  His alcohol level is elevated at 329 and his UDS is positive for THC and benzodiazepines.  On reevaluation he is resting comfortably no apparent distress.  Tolerating p.o. without difficulty.  He is medically cleared for TTS evaluation at this time.  Final Clinical Impressions(s) / ED Diagnoses   Final diagnoses:    Involuntary commitment  Alcoholic intoxication without complication [facility](HCC)  Superficial abrasion    ED Discharge Orders    None       Jeanie SewerFawze, Auda Finfrock A, PA-C 10/21/18 1139    Mancel BaleWentz, Elliott, MD 10/21/18 1840

## 2018-10-21 NOTE — ED Notes (Signed)
Bed: WTR5 Expected date:  Expected time:  Means of arrival:  Comments: 

## 2018-10-21 NOTE — ED Triage Notes (Signed)
Pt arrived via [facility] intoxicated, pt statin g he found out his girlfriend was cheating on him and began drinking. Pts mother concerned and placed under IVC. Pt denies SI and HI at this time. Per ems patient had two unwitnessed falls tonight. Pt oriented x1.
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
## 2018-10-21 NOTE — BH Assessment (Signed)
Continue to be unable to reach pt for telepsych due to busy signal. Requesting staff notify TTS when machine is up and ready.

## 2018-10-21 NOTE — Tx Team (Signed)
Initial Treatment Plan 10/21/2018 2:14 PM Gerold Sar [id number]    PATIENT STRESSORS: Marital or family conflict Medication change or noncompliance Substance abuse   PATIENT STRENGTHS: Average or above average intelligence Capable of independent living General fund of knowledge Motivation for treatment/growth Supportive family/friends   PATIENT IDENTIFIED PROBLEMS: "dry up and go home"  Substance Abuse                   DISCHARGE CRITERIA:  Ability to meet basic life and health needs Adequate post-discharge living arrangements Improved stabilization in mood, thinking, and/or behavior Medical problems require only outpatient monitoring Motivation to continue treatment in a less acute level of care Need for constant or close observation no longer present Reduction of life-threatening or endangering symptoms to within safe limits Safe-care adequate arrangements made Verbal commitment to aftercare and medication compliance Withdrawal symptoms are absent or subacute and managed without 24-hour nursing intervention  PRELIMINARY DISCHARGE PLAN: Outpatient therapy  PATIENT/FAMILY INVOLVEMENT: This treatment plan has been presented to and reviewed with the patient, [name].  The patient and family have been given the opportunity to ask questions and make suggestions.  Annia Friendly, RN 10/21/2018, 2:14 PM

## 2018-10-21 NOTE — ED Notes (Signed)
Back from testing.

## 2018-10-21 NOTE — Progress Notes (Signed)
D: Pt denies SI/HI/AVH. Pt is pleasant and cooperative. Pt stated he did a stupid thing to get here and he wanted to get D/C . Pt encouraged to talk with doctor but was informed the avg length of stay was 3-7 days. Pt visible on the unit, pt kept to himself even while visible on the milieu.  A: Pt was offered support and encouragement. Pt was given scheduled medications. Pt was encourage to attend groups. Q 15 minute checks were done for safety.  R:. Pt is taking medication. Pt has no complaints.Pt receptive to treatment and safety maintained on unit.
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
## 2018-10-21 NOTE — BH Assessment (Addendum)
[facility] Assessment Progress Note  Per Buford Dresser, DO, this pt requires psychiatric hospitalization.  Letitia Libra, RN, [facility] has assigned pt to [facility] Rm 303-1.  Pt presents under IVC initiated by pt's mother, and upheld by Dr Mariea Clonts, and IVC documents have been faxed to [facility].  Pt's nurse, Verline Lema, has been notified, and agrees to call report to [phone].  Pt is to be transported via Event organiser.   Jalene Mullet, [place] Coordinator [phone]

## 2018-10-21 NOTE — BH Assessment (Signed)
Request for TTS set up made at 0748. Continually getting busy signal. Marzetta Board, charge nurse notified.

## 2018-10-21 NOTE — ED Notes (Signed)
Pt dressed out into maroon scrubs and belongings placed behind nurse's desk.

## 2018-10-21 NOTE — BH Assessment (Signed)
Tele Assessment Note   Patient Name: Wyatt ArchZacharias Hippert MRN: [id number] Referring Physician: Effie ShyWentz [facility] Location of Provider: Behavioral Health TTS Department  [name] is an 33 y.o. male [facility] under [facility]. IVC is initiated by patient's mother, Earley AbideHilda [phone]). Per IVC patient has not been taking medications, abusing alcohol, and stated "today is the day I will." [name] concerned he will follow through with a suicidal act.   Upon this clinician's exam patient is tearful, however denies SI/HI/AVH. Patient renders conflicting history from chart review throughout assessment.  Patient states he is here because "I drank too much." He states he is doing "better" with his drinking and only drank 4 beers last night, however BAL is 309 upon arrival. Patient denies any other substance use, however his UDS is positive for THC and benzodiazepines. When confronted patient states his girlfriend gave them to him. Patient denies any prior suicide attempts, however per chart review, patient attempted to hang himself in May 2020 and [facility]. Patient denies any history of abuse. There is a family history of mental illness and his father committed suicide by hanging. Patient states that he recently found out the mother of his 35 year old son had cheated on him, contributing to his depression. He currently has charges for stalking and communicating a threat. Patient gave verbal consent for TTS to contact his mother, Earley AbideHilda. This clinician attempted to reach her, however she did not answer, left a HIPPA compliant voicemail.   Patient is alert and oriented. He is dressed in scrubs, wrapped in a blanket, and sitting upright in bed. His speech is soft/slurred, eye contact is poor, and thoughts are organized. He appears mildly intoxicated. His mood is depressed and his affect is tearful. His insight, judgement, and impulse control are impaired.  Patient does not appear to be responding to internal stimuli or experiencing delusional thought content.  Disposition: Dr. Sharma CovertNorman and Elta GuadeloupeLaurie Parks, NP recommend inpatient treatment. [facility] reviewing.  Diagnosis: F33.2 MDD, recurrent, severe   F10.20 Alcohol use disorder, severe  Past Medical History:  Past Medical History:  Diagnosis Date  . Anxiety   . OCD (obsessive compulsive disorder)     History reviewed. No pertinent surgical history.  Family History: History reviewed. No pertinent family history.  Social History:  reports that he has been smoking cigarettes. He has been smoking about 1.00 pack per day. He has never used smokeless tobacco. He reports current alcohol use. He reports previous drug use.  Additional Social History:  Alcohol / Drug Use Pain Medications: see MAR Prescriptions: see MAR Over the Counter: see MAR History of alcohol / drug use?: Yes Longest period of sobriety (when/how long): UTA Substance #1 Name of Substance 1: Alcohol 1 - Age of First Use: UTA 1 - Amount (size/oz): 4 beers 1 - Frequency: daily 1 - Duration: 2 years 1 - Last Use / Amount: 10/20/2018- 4 beers  CIWA: CIWA-Ar BP: 108/90 Pulse Rate: 71 COWS:    Allergies: No Known Allergies  Home Medications: (Not in a hospital admission)   OB/GYN Status:  No LMP for male patient.  General Assessment Data Assessment unable to be completed: Yes Reason for not completing assessment: (machine not connecting. Charge RN notified) [facility] TTS Assessment: In system Is this a Tele or Face-to-Face Assessment?: Tele Assessment Is this an Initial Assessment or a Re-assessment for this encounter?: Initial Assessment Patient Accompanied by:: N/A Language Other  than English: No Living Arrangements: Other (Comment)(with mother and son) What gender do you identify as?: Male Marital status: Separated Maiden name: Fowle Pregnancy Status: No Living  Arrangements: Parent, Children Can pt return to current living arrangement?: Yes Admission Status: Involuntary Petitioner: Family member Is patient capable of signing voluntary admission?: No Referral Source: Self/Family/Friend Insurance type: None     Crisis Care Plan Living Arrangements: Parent, Children Legal Guardian: (self) Name of Psychiatrist: none Name of Therapist: none  Education Status Is patient currently in school?: No Is the patient employed, unemployed or receiving disability?: Unemployed  Risk to self with the past 6 months Suicidal Ideation: No-Not Currently/Within Last 6 Months Has patient been a risk to self within the past 6 months prior to admission? : Yes Suicidal Intent: No-Not Currently/Within Last 6 Months Has patient had any suicidal intent within the past 6 months prior to admission? : Yes Is patient at risk for suicide?: Yes Suicidal Plan?: No-Not Currently/Within Last 6 Months Has patient had any suicidal plan within the past 6 months prior to admission? : Yes Specify Current Suicidal Plan: hanging Access to Means: Yes Specify Access to Suicidal Means: rope What has been your use of drugs/alcohol within the last 12 months?: daily alcohol use Previous Attempts/Gestures: Yes How many times?: 2 Other Self Harm Risks: alcohol and drug use Triggers for Past Attempts: None known Intentional Self Injurious Behavior: None Family Suicide History: Multimedia programmerYes(father) Recent stressful life event(s): Divorce, Conflict (Comment), Job Loss(with ex) Persecutory voices/beliefs?: No Depression: Yes Depression Symptoms: Despondent, Insomnia, Tearfulness, Isolating, Fatigue, Guilt, Loss of interest in usual pleasures, Feeling worthless/self pity, Feeling angry/irritable Substance abuse history and/or treatment for substance abuse?: Yes Suicide prevention information given to non-admitted patients: Not applicable  Risk to Others within the past 6 months Homicidal  Ideation: No Does patient have any lifetime risk of violence toward others beyond the six months prior to admission? : No Thoughts of Harm to Others: No Current Homicidal Intent: No Current Homicidal Plan: No Access to Homicidal Means: No Identified Victim: none History of harm to others?: No Assessment of Violence: None Noted Violent Behavior Description: none noted Does patient have access to weapons?: No Criminal Charges Pending?: Yes Describe Pending Criminal Charges: stalking, communicating a threat Does patient have a court date: Yes Court Date: (UTA) Is patient on probation?: No  Psychosis Hallucinations: None noted Delusions: None noted  Mental Status Report Appearance/Hygiene: In scrubs, Disheveled Eye Contact: Poor Motor Activity: Restlessness Speech: Slow, Slurred Level of Consciousness: Drowsy Mood: Depressed Affect: Depressed Anxiety Level: Minimal Thought Processes: Coherent, Relevant Judgement: Impaired Orientation: Person, Place, Time, Situation Obsessive Compulsive Thoughts/Behaviors: None  Cognitive Functioning Concentration: Fair Memory: Recent Impaired, Remote Intact Is patient IDD: No Insight: Poor Impulse Control: Poor Appetite: Good Have you had any weight changes? : No Change Amount of the weight change? (lbs): (55) Sleep: Decreased Total Hours of Sleep: 2 Vegetative Symptoms: None  ADLScreening [facility] Assessment Services) Patient's cognitive ability adequate to safely complete daily activities?: Yes Patient able to express need for assistance with ADLs?: Yes Independently performs ADLs?: Yes (appropriate for developmental age)  Prior Inpatient Therapy Prior Inpatient Therapy: Yes Prior Therapy Dates: 2020 [facility] Reason for Treatment: suicide attempt  Prior Outpatient Therapy Prior Outpatient Therapy: Yes Prior Therapy Dates: November Prior Therapy Facilty/Provider(s): [facility] Reason for  Treatment: anxiety Does patient have an ACCT team?: No Does patient have Intensive In-House Services?  : No Does patient have [facility] services? : No Does patient  have P4CC services?: No  ADL Screening (condition at time of admission) Patient's cognitive ability adequate to safely complete daily activities?: Yes Is the patient deaf or have difficulty hearing?: No Does the patient have difficulty seeing, even when wearing glasses/contacts?: No Does the patient have difficulty concentrating, remembering, or making decisions?: No Patient able to express need for assistance with ADLs?: Yes Does the patient have difficulty dressing or bathing?: No Independently performs ADLs?: Yes (appropriate for developmental age) Does the patient have difficulty walking or climbing stairs?: No Weakness of Legs: None Weakness of Arms/Hands: None  Home Assistive Devices/Equipment Home Assistive Devices/Equipment: None  Therapy Consults (therapy consults require a physician order) PT Evaluation Needed: No OT Evalulation Needed: No SLP Evaluation Needed: No       Advance Directives (For Healthcare) Does Patient Have a Medical Advance Directive?: No Would patient like information on creating a medical advance directive?: No - Patient declined          Disposition: Dr. Mariea Clonts and Jinny Blossom, NP recommend inpatient treatment. [place] reviewing. Disposition Initial Assessment Completed for this Encounter: Yes  This service was provided via telemedicine using a 2-way, interactive audio and video technology.  Names of all persons participating in this telemedicine service and their role in this encounter. Name: Taygen Acklin Role: patient  Name: Orvis Brill, LCSW Role: TTS  Name:  Role:   Name:  Role:     Orvis Brill 10/21/2018 10:09 AM

## 2018-10-22 MED ORDER — NICOTINE 21 MG/24HR TD PT24
21.0000 mg | MEDICATED_PATCH | Freq: Every day | TRANSDERMAL | Status: DC
  Administered 2018-10-22 – 2018-10-24 (×3): 21 mg via TRANSDERMAL
  Filled 2018-10-22 (×4): qty 1

## 2018-10-22 NOTE — Progress Notes (Signed)
[place] NOVEL CORONAVIRUS (COVID-19) DAILY CHECK-OFF SYMPTOMS - answer yes or no to each - every day NO YES  Have you had a fever in the past 24 hours?  . Fever (Temp > 37.80C / 100F) X   Have you had any of these symptoms in the past 24 hours? . New Cough .  Sore Throat  .  Shortness of Breath .  Difficulty Breathing .  Unexplained Body Aches   X   Have you had any one of these symptoms in the past 24 hours not related to allergies?   . Runny Nose .  Nasal Congestion .  Sneezing   X   If you have had runny nose, nasal congestion, sneezing in the past 24 hours, has it worsened?  X   EXPOSURES - check yes or no X   Have you traveled outside the state in the past 14 days?  X   Have you been in contact with someone with a confirmed diagnosis of COVID-19 or PUI in the past 14 days without wearing appropriate PPE?  X   Have you been living in the same home as a person with confirmed diagnosis of COVID-19 or a PUI (household contact)?    X   Have you been diagnosed with COVID-19?    X              What to do next: Answered NO to all: Answered YES to anything:   Proceed with unit schedule Follow the [facility] Inpatient Flowsheet.   

## 2018-10-22 NOTE — [facility] Suicide Risk Assessment (Signed)
[place] INPATIENT:  Family/Significant Other Suicide Prevention Education  Suicide Prevention Education:  Contact Attempts: with mother, Eben Choinski [phone] has been identified by the patient as the family member/significant other with whom the patient will be residing, and identified as the person(s) who will aid the patient in the event of a mental health crisis.  With written consent from the patient, two attempts were made to provide suicide prevention education, prior to and/or following the patient's discharge.  We were unsuccessful in providing suicide prevention education.  A suicide education pamphlet was given to the patient to share with family/significant other.  Date and time of first attempt: 10/22/2018 at 10:35am. HIPAA compliant voicemail left with callback information. Date and time of second attempt: to be completed at a later time  Joellen Jersey 10/22/2018, 10:34 AM

## 2018-10-22 NOTE — H&P (Signed)
Psychiatric Admission Assessment Adult  Patient Identification: [name] MRN:  [id number] Date of Evaluation:  10/22/2018 Chief Complaint:  ALCOHOL USE DISORDER MDD Principal Diagnosis: <principal problem not specified> Diagnosis:  Active Problems:   Alcohol abuse with alcohol-induced mood disorder (HCC)  History of Present Illness:  Mr. [name] is 33 years of age he presented intoxicated with a blood alcohol level of 309 and he was petition for involuntary commitment for making suicidal statements.  His history is inconsistent he told me he just relapsed 1 day "got drunk on a train" coming back from seeing his girlfriend from [place] and his mother was worried and petitioned him simply to stop drinking.  However he denies suicidal thoughts states he did not make such statements he tells me his face is scratched up because of a "skateboarding accident" he told other examiners that he banged his head opening a door, further drug screen showed cannabis and benzodiazepines though he denied any other substance abuse.  Thus because his story is inconsistent he has a history of attempting to hang himself while impaired, we will go ahead and monitor him through the weekend to monitor for withdrawal gather collateral history again so forth.   According to the assessment team note of 7/9 [name] is an 33 y.o. male [facility] under IVC. IVC is initiated by patient's mother, [name] [phone]). Per IVC patient has not been taking medications, abusing alcohol, and stated "today is the day I will." [name] concerned he will follow through with a suicidal act.   Upon this clinician's exam patient is tearful, however denies SI/HI/AVH. Patient renders conflicting history from chart review throughout assessment.  Patient states he is here because "I drank too much." He states he is doing "better" with his drinking and only drank 4 beers last night, however  BAL is 309 upon arrival. Patient denies any other substance use, however his UDS is positive for THC and benzodiazepines. When confronted patient states his girlfriend gave them to him. Patient denies any prior suicide attempts, however per chart review, patient attempted to hang himself in May 2020 and [facility]. Patient denies any history of abuse. There is a family history of mental illness and his father committed suicide by hanging. Patient states that he recently found out the mother of his 35 year old son had cheated on him, contributing to his depression. He currently has charges for stalking and communicating a threat. Patient gave verbal consent for TTS to contact his mother, [name]. This clinician attempted to reach her, however she did not answer, left a HIPPA compliant voicemail.   Patient is alert and oriented. He is dressed in scrubs, wrapped in a blanket, and sitting upright in bed. His speech is soft/slurred, eye contact is poor, and thoughts are organized. He appears mildly intoxicated. His mood is depressed and his affect is tearful. His insight, judgement, and impulse control are impaired. Patient does not appear to be responding to internal stimuli or experiencing delusional thought content. Associated Signs/Symptoms: Depression Symptoms:  depressed mood, (Hypo) Manic Symptoms:  Impulsivity, Anxiety Symptoms:  n/a Psychotic Symptoms:  n/a PTSD Symptoms: NA Total Time spent with patient: 45 minutes  Past Psychiatric History: Last here May 2020  Is the patient at risk to self? Yes.    Has the patient been a risk to self in the past 6 months? Yes.    Has the patient been a risk to self within the distant past? No.  Is the patient a risk to others? No.  Has the patient been a risk to others in the past 6 months? No.  Has the patient been a risk to others within the distant past? No.   Prior Inpatient Therapy:  Here May 2020 Prior Outpatient Therapy:  Patient  apparently did not follow-up  Alcohol Screening: 1. How often do you have a drink containing alcohol?: Never 2. How many drinks containing alcohol do you have on a typical day when you are drinking?: 1 or 2 3. How often do you have six or more drinks on one occasion?: Less than monthly AUDIT-C Score: 1 4. How often during the last year have you found that you were not able to stop drinking once you had started?: Never 5. How often during the last year have you failed to do what was normally expected from you becasue of drinking?: Never 6. How often during the last year have you needed a first drink in the morning to get yourself going after a heavy drinking session?: Never 7. How often during the last year have you had a feeling of guilt of remorse after drinking?: Never 8. How often during the last year have you been unable to remember what happened the night before because you had been drinking?: Never 9. Have you or someone else been injured as a result of your drinking?: Yes, during the last year 10. Has a relative or friend or a doctor or another health worker been concerned about your drinking or suggested you cut down?: Yes, during the last year Alcohol Use Disorder Identification Test Final Score (AUDIT): 9 Alcohol Brief Interventions/Follow-up: Alcohol Education, Brief Advice, Continued Monitoring Substance Abuse History in the last 12 months:  Yes.   Consequences of Substance Abuse: Medical Consequences:  Has required detox Previous Psychotropic Medications: Yes  Psychological Evaluations: No  Past Medical History:  Past Medical History:  Diagnosis Date  . Anxiety   . OCD (obsessive compulsive disorder)    History reviewed. No pertinent surgical history. Family History: History reviewed. No pertinent family history. Family Psychiatric  History: No new data shared from last admission Tobacco Screening: Have you used any form of tobacco in the last 30 days? (Cigarettes, Smokeless  Tobacco, Cigars, and/or Pipes): Yes Tobacco use, Select all that apply: 5 or more cigarettes per day Are you interested in Tobacco Cessation Medications?: Yes, will notify MD for an order Counseled patient on smoking cessation including recognizing danger situations, developing coping skills and basic information about quitting provided: Refused/Declined practical counseling Social History:  Social History   Substance and Sexual Activity  Alcohol Use Yes     Social History   Substance and Sexual Activity  Drug Use Yes  . Types: Marijuana    Additional Social History:                           Allergies:  No Known Allergies Lab Results:  Results for orders placed or performed during the hospital encounter of 10/21/18 (from the past 48 hour(s))  Rapid urine drug screen (hospital performed)     Status: Abnormal   Collection Time: 10/21/18  6:41 AM  Result Value Ref Range   Opiates NONE DETECTED NONE DETECTED   Cocaine NONE DETECTED NONE DETECTED   Benzodiazepines POSITIVE (A) NONE DETECTED   Amphetamines NONE DETECTED NONE DETECTED   Tetrahydrocannabinol POSITIVE (A) NONE DETECTED   Barbiturates NONE DETECTED NONE DETECTED    Comment: (  NOTE) DRUG SCREEN FOR MEDICAL PURPOSES ONLY.  IF CONFIRMATION IS NEEDED FOR ANY PURPOSE, NOTIFY LAB WITHIN 5 DAYS. LOWEST DETECTABLE LIMITS FOR URINE DRUG SCREEN Drug Class                     Cutoff (ng/mL) Amphetamine and metabolites    1000 Barbiturate and metabolites    200 Benzodiazepine                 200 Tricyclics and metabolites     300 Opiates and metabolites        300 Cocaine and metabolites        300 THC                            50 Performed at [facility], [address] [place], [place] [address]   Comprehensive metabolic panel     Status: Abnormal   Collection Time: 10/21/18  6:43 AM  Result Value Ref Range   Sodium 146 (H) 135 - 145 mmol/L   Potassium 4.2 3.5 - 5.1 mmol/L   Chloride  110 98 - 111 mmol/L   CO2 26 22 - 32 mmol/L   Glucose, Bld 99 70 - 99 mg/dL   BUN 8 6 - 20 mg/dL   Creatinine, Ser 9.56 0.61 - 1.24 mg/dL   Calcium 8.5 (L) 8.9 - 10.3 mg/dL   Total Protein 7.2 6.5 - 8.1 g/dL   Albumin 4.1 3.5 - 5.0 g/dL   AST 29 15 - 41 U/L   ALT 21 0 - 44 U/L   Alkaline Phosphatase 55 38 - 126 U/L   Total Bilirubin 0.2 (L) 0.3 - 1.2 mg/dL   GFR calc non Af Amer >60 >60 mL/min   GFR calc Af Amer >60 >60 mL/min   Anion gap 10 5 - 15    Comment: Performed at [facility] [address] [place], [place] [address]  Ethanol     Status: Abnormal   Collection Time: 10/21/18  6:43 AM  Result Value Ref Range   Alcohol, Ethyl (B) 329 (HH) <10 mg/dL    Comment: CRITICAL RESULT CALLED TO, READ BACK BY AND VERIFIED WITH: WEST,S. RN AT 6578 10/21/18 MULLINS,T (NOTE) Lowest detectable limit for serum alcohol is 10 mg/dL. For medical purposes only. Performed at [facility], [address] [place], [place] [address]   cbc     Status: Abnormal   Collection Time: 10/21/18  6:43 AM  Result Value Ref Range   WBC 5.9 4.0 - 10.5 K/uL   RBC 4.30 4.22 - 5.81 MIL/uL   Hemoglobin 15.9 13.0 - 17.0 g/dL   HCT 95.2 84.1 - 32.4 %   MCV 106.7 (H) 80.0 - 100.0 fL   MCH 37.0 (H) 26.0 - 34.0 pg   MCHC 34.6 30.0 - 36.0 g/dL   RDW 40.1 (L) 02.7 - 25.3 %   Platelets 182 150 - 400 K/uL   nRBC 0.0 0.0 - 0.2 %    Comment: Performed at [facility], [address], [place], [place] [address]  Acetaminophen level     Status: Abnormal   Collection Time: 10/21/18  7:44 AM  Result Value Ref Range   Acetaminophen (Tylenol), Serum <10 (L) 10 - 30 ug/mL    Comment: (NOTE) Therapeutic concentrations vary significantly. A range of 10-30 ug/mL  may be an effective concentration for many patients. However,  some  are best treated at concentrations outside of this range. Acetaminophen concentrations >150 ug/mL at 4 hours after ingestion  and  >50 ug/mL at 12 hours after ingestion are often associated with  toxic reactions. Performed at [facility], [place] [address], [place], [place] [address]   Salicylate level     Status: None   Collection Time: 10/21/18  7:44 AM  Result Value Ref Range   Salicylate Lvl <3.2 2.8 - 30.0 mg/dL    Comment: Performed at [facility], [place] [address], [place], [place] [address]  SARS Coronavirus 2 (CEPHEID - Performed in [place] hospital lab), Hosp Order     Status: None   Collection Time: 10/21/18  8:43 AM   Specimen: Nasopharyngeal Swab  Result Value Ref Range   SARS Coronavirus 2 NEGATIVE NEGATIVE    Comment: (NOTE) If result is NEGATIVE SARS-CoV-2 target nucleic acids are NOT DETECTED. The SARS-CoV-2 RNA is generally detectable in upper and lower  respiratory specimens during the acute phase of infection. The lowest  concentration of SARS-CoV-2 viral copies this assay can detect is 250  copies / mL. A negative result does not preclude SARS-CoV-2 infection  and should not be used as the sole basis for treatment or other  patient management decisions.  A negative result may occur with  improper specimen collection / handling, submission of specimen other  than nasopharyngeal swab, presence of viral mutation(s) within the  areas targeted by this assay, and inadequate number of viral copies  (<250 copies / mL). A negative result must be combined with clinical  observations, patient history, and epidemiological information. If result is POSITIVE SARS-CoV-2 target nucleic acids are DETECTED. The SARS-CoV-2 RNA is generally detectable in upper and lower  respiratory specimens dur ing the acute phase of infection.  Positive  results are indicative of active infection with SARS-CoV-2.  Clinical  correlation with patient history and other diagnostic information is  necessary to determine patient infection status.  Positive results do  not rule out  bacterial infection or co-infection with other viruses. If result is PRESUMPTIVE POSTIVE SARS-CoV-2 nucleic acids MAY BE PRESENT.   A presumptive positive result was obtained on the submitted specimen  and confirmed on repeat testing.  While 2019 novel coronavirus  (SARS-CoV-2) nucleic acids may be present in the submitted sample  additional confirmatory testing may be necessary for epidemiological  and / or clinical management purposes  to differentiate between  SARS-CoV-2 and other Sarbecovirus currently known to infect humans.  If clinically indicated additional testing with an alternate test  methodology [id number]) is advised. The SARS-CoV-2 RNA is generally  detectable in upper and lower respiratory sp ecimens during the acute  phase of infection. The expected result is Negative. Fact Sheet for Patients:  StrictlyIdeas.no Fact Sheet for Healthcare Providers: BankingDealers.co.za This test is not yet approved or cleared by the [place] FDA and has been authorized for detection and/or diagnosis of SARS-CoV-2 by FDA under an Emergency Use Authorization (EUA).  This EUA will remain in effect (meaning this test can be used) for the duration of the COVID-19 declaration under Section 564(b)(1) of the Act, 21 U.S.C. section 360bbb-3(b)(1), unless the authorization is terminated or revoked sooner. Performed at [facility], [place] [address], [place], [place] [address]     Blood Alcohol level:  Lab Results  Component Value Date   ETH 329 [facility]) 10/21/2018   ETH 409 (HH) 74/25/9563    Metabolic Disorder Labs:  Lab Results  Component Value Date   HGBA1C 4.9 08/25/2018   MPG 93.93 08/25/2018   No results found for: PROLACTIN Lab Results  Component Value Date   CHOL 207 (H) 08/25/2018   TRIG 20 08/25/2018   HDL >135 08/25/2018   CHOLHDL NOT CALCULATED 08/25/2018   VLDL 4 08/25/2018   LDLCALC NOT CALCULATED  08/25/2018    Current Medications: Current Facility-Administered Medications  Medication Dose Route Frequency Provider Last Rate Last Dose  . acetaminophen (TYLENOL) tablet 650 mg  650 mg Oral Q4H PRN Laveda AbbeParks, Laurie Britton, NP      . alum & mag hydroxide-simeth (MAALOX/MYLANTA) 200-200-20 MG/5ML suspension 30 mL  30 mL Oral Q6H PRN Laveda AbbeParks, Laurie Britton, NP      . chlordiazePOXIDE (LIBRIUM) capsule 25 mg  25 mg Oral Q6H PRN Malvin JohnsFarah, Diara Chaudhari, MD      . chlordiazePOXIDE (LIBRIUM) capsule 25 mg  25 mg Oral QID Malvin JohnsFarah, Edel Rivero, MD   25 mg at 10/21/18 1640   Followed by  . [START ON 10/23/2018] chlordiazePOXIDE (LIBRIUM) capsule 25 mg  25 mg Oral TID Malvin JohnsFarah, Laure Leone, MD       Followed by  . [START ON 10/24/2018] chlordiazePOXIDE (LIBRIUM) capsule 25 mg  25 mg Oral Nicki GuadalajaraBH-qamhs Miko Markwood, MD       Followed by  . [START ON 10/25/2018] chlordiazePOXIDE (LIBRIUM) capsule 25 mg  25 mg Oral Daily Malvin JohnsFarah, Azariah Bonura, MD      . hydrOXYzine (ATARAX/VISTARIL) tablet 25 mg  25 mg Oral TID PRN Laveda AbbeParks, Laurie Britton, NP   25 mg at 10/21/18 2054  . magnesium hydroxide (MILK OF MAGNESIA) suspension 30 mL  30 mL Oral Daily PRN Laveda AbbeParks, Laurie Britton, NP      . nicotine (NICODERM CQ - dosed in mg/24 hours) patch 21 mg  21 mg Transdermal Daily Cobos, Rockey SituFernando A, MD   21 mg at 10/22/18 0825  . nicotine polacrilex (NICORETTE) gum 2 mg  2 mg Oral PRN Jackelyn PolingBerry, Jason A, NP   2 mg at 10/21/18 2044  . ondansetron (ZOFRAN) tablet 4 mg  4 mg Oral Q8H PRN Laveda AbbeParks, Laurie Britton, NP      . thiamine (VITAMIN B-1) tablet 100 mg  100 mg Oral Daily Laveda AbbeParks, Laurie Britton, NP   100 mg at 10/22/18 [id number]   Or  . thiamine (B-1) injection 100 mg  100 mg Intravenous Daily Laveda AbbeParks, Laurie Britton, NP      . traZODone (DESYREL) tablet 50 mg  50 mg Oral QHS PRN Laveda AbbeParks, Laurie Britton, NP   50 mg at 10/21/18 2054  . traZODone (DESYREL) tablet 50 mg  50 mg Oral Once Nira ConnBerry, Jason A, NP       PTA Medications: Medications Prior to Admission  Medication Sig Dispense  Refill Last Dose  . acamprosate (CAMPRAL) 333 MG tablet Take 2 tablets (666 mg total) by mouth 3 (three) times daily with meals. (Patient not taking: Reported on 10/21/2018) 180 tablet 0   . nicotine (NICODERM CQ - DOSED IN MG/24 HOURS) 21 mg/24hr patch Place 1 patch (21 mg total) onto the skin daily. (Patient not taking: Reported on 10/21/2018) 28 patch 0     Musculoskeletal: Strength & Muscle Tone: within normal limits Gait & Station: normal Patient leans: N/A  Psychiatric Specialty Exam: Physical Exam some abrasions on face hypertensive on exam  ROS neurological denies seizures or head trauma cardiovascular denies issues GI GU denies issues endocrine denies issues  Blood pressure (!) 142/91, pulse 64, temperature 98.3 F (  36.8 C), resp. rate 18, height  (1.854 m), weight 77.1 kg, SpO2 98 %.Body mass index is 22.43 kg/m.  General Appearance: Casual  Eye Contact:  Good  Speech:  Clear and Coherent  Volume:  Normal  Mood:  Euthymic  Affect:  Congruent  Thought Process-generally coherent goal-directed with intact associations  Orientation:  Full (Time, Place, and Person)  Thought Content:  Logical  Suicidal Thoughts:  No  Homicidal Thoughts:  No  Memory:  Immediate;   Fair  Judgement:  Poor  Insight:  Shallow  Psychomotor Activity:  Normal  Concentration:  Concentration: Fair  Recall:  [employer] of Knowledge:  Fair  Language:  Fair  Akathisia:  Negative  Handed:  Right  AIMS (if indicated):     Assets:  Communication Skills Housing  ADL's:  Intact  Cognition:  WNL  Sleep:  Number of Hours: 6.75    Treatment Plan Summary: Daily contact with patient to assess and evaluate symptoms and progress in treatment, Medication management and Plan Patient is requesting discharge however his story is inconsistent and he has a history of dangerousness therefore we will honor the petition at this point monitor through the weekend monitor for withdrawal.  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/complicated by alcohol intoxication and abuse/history of substance abuse drug screen showing cannabis and benzodiazepines as well   Physician Treatment Plan for Primary Diagnosis: <principal problem not specified> Long Term Goal(s): Improvement in symptoms so as ready for discharge  Short Term Goals: Ability to verbalize feelings will improve, Ability to disclose and discuss suicidal ideas, Ability to demonstrate self-control will improve, Ability to identify and develop effective coping behaviors will improve, Ability to maintain clinical measurements within normal limits will improve, Compliance with prescribed medications will improve and Ability to identify triggers associated with substance abuse/mental health issues will improve  Physician Treatment Plan for Secondary Diagnosis: Active Problems:   Alcohol abuse with alcohol-induced mood disorder (HCC)  Long Term Goal(s): Improvement in symptoms so as ready for discharge  Short Term Goals: Ability to disclose and discuss suicidal ideas, Ability to demonstrate self-control will improve and Ability to identify and develop effective coping behaviors will improve  I certify that inpatient services furnished can reasonably be expected to improve the patient's condition.    Malvin Johns, MD 7/10/20209:24 AM

## 2018-10-22 NOTE — Tx Team (Signed)
Interdisciplinary Treatment and Diagnostic Plan Update  10/22/2018 Time of Session: 9:00am [name] MRN: [id number]  Principal Diagnosis: <principal problem not specified>  Secondary Diagnoses: Active Problems:   Alcohol abuse with alcohol-induced mood disorder (HCC)   Current Medications:  Current Facility-Administered Medications  Medication Dose Route Frequency Provider Last Rate Last Dose  . acetaminophen (TYLENOL) tablet 650 mg  650 mg Oral Q4H PRN Ethelene Hal, NP      . alum & mag hydroxide-simeth (MAALOX/MYLANTA) 200-200-20 MG/5ML suspension 30 mL  30 mL Oral Q6H PRN Ethelene Hal, NP      . chlordiazePOXIDE (LIBRIUM) capsule 25 mg  25 mg Oral Q6H PRN Johnn Hai, MD      . chlordiazePOXIDE (LIBRIUM) capsule 25 mg  25 mg Oral QID Johnn Hai, MD   25 mg at 10/21/18 1640   Followed by  . [START ON 10/23/2018] chlordiazePOXIDE (LIBRIUM) capsule 25 mg  25 mg Oral TID Johnn Hai, MD       Followed by  . [START ON 10/24/2018] chlordiazePOXIDE (LIBRIUM) capsule 25 mg  25 mg Oral Celine Ahr, MD       Followed by  . [START ON 10/25/2018] chlordiazePOXIDE (LIBRIUM) capsule 25 mg  25 mg Oral Daily Johnn Hai, MD      . hydrOXYzine (ATARAX/VISTARIL) tablet 25 mg  25 mg Oral TID PRN Ethelene Hal, NP   25 mg at 10/21/18 2054  . magnesium hydroxide (MILK OF MAGNESIA) suspension 30 mL  30 mL Oral Daily PRN Ethelene Hal, NP      . nicotine (NICODERM CQ - dosed in mg/24 hours) patch 21 mg  21 mg Transdermal Daily Cobos, Myer Peer, MD   21 mg at 10/22/18 0825  . nicotine polacrilex (NICORETTE) gum 2 mg  2 mg Oral PRN Rozetta Nunnery, NP   2 mg at 10/21/18 2044  . ondansetron (ZOFRAN) tablet 4 mg  4 mg Oral Q8H PRN Ethelene Hal, NP      . thiamine (VITAMIN B-1) tablet 100 mg  100 mg Oral Daily Ethelene Hal, NP   100 mg at 10/22/18 1941   Or  . thiamine (B-1) injection 100 mg  100 mg Intravenous Daily Ethelene Hal, NP      . traZODone (DESYREL) tablet 50 mg  50 mg Oral QHS PRN Ethelene Hal, NP   50 mg at 10/21/18 2054  . traZODone (DESYREL) tablet 50 mg  50 mg Oral Once Lindon Romp A, NP       PTA Medications: Medications Prior to Admission  Medication Sig Dispense Refill Last Dose  . acamprosate (CAMPRAL) 333 MG tablet Take 2 tablets (666 mg total) by mouth 3 (three) times daily with meals. (Patient not taking: Reported on 10/21/2018) 180 tablet 0   . nicotine (NICODERM CQ - DOSED IN MG/24 HOURS) 21 mg/24hr patch Place 1 patch (21 mg total) onto the skin daily. (Patient not taking: Reported on 10/21/2018) 28 patch 0     Patient Stressors: Marital or family conflict Medication change or noncompliance Substance abuse  Patient Strengths: Average or above average intelligence Capable of independent living General fund of knowledge Motivation for treatment/growth Supportive family/friends  Treatment Modalities: Medication Management, Group therapy, Case management,  1 to 1 session with clinician, Psychoeducation, Recreational therapy.   Physician Treatment Plan for Primary Diagnosis: <principal problem not specified> Long Term Goal(s):     Short Term Goals:    Medication Management: Evaluate patient's response,  side effects, and tolerance of medication regimen.  Therapeutic Interventions: 1 to 1 sessions, Unit Group sessions and Medication administration.  Evaluation of Outcomes: Not Met  Physician Treatment Plan for Secondary Diagnosis: Active Problems:   Alcohol abuse with alcohol-induced mood disorder ([name])  Long Term Goal(s):     Short Term Goals:       Medication Management: Evaluate patient's response, side effects, and tolerance of medication regimen.  Therapeutic Interventions: 1 to 1 sessions, Unit Group sessions and Medication administration.  Evaluation of Outcomes: Not Met   RN Treatment Plan for Primary Diagnosis: <principal problem not specified> Long  Term Goal(s): Knowledge of disease and therapeutic regimen to maintain health will improve  Short Term Goals: Ability to identify and develop effective coping behaviors will improve and Compliance with prescribed medications will improve  Medication Management: RN will administer medications as ordered by provider, will assess and evaluate patient's response and provide education to patient for prescribed medication. RN will report any adverse and/or side effects to prescribing provider.  Therapeutic Interventions: 1 on 1 counseling sessions, Psychoeducation, Medication administration, Evaluate responses to treatment, Monitor vital signs and CBGs as ordered, Perform/monitor CIWA, COWS, AIMS and Fall Risk screenings as ordered, Perform wound care treatments as ordered.  Evaluation of Outcomes: Not Met   LCSW Treatment Plan for Primary Diagnosis: <principal problem not specified> Long Term Goal(s): Safe transition to appropriate next level of care at discharge, Engage patient in therapeutic group addressing interpersonal concerns.  Short Term Goals: Engage patient in aftercare planning with referrals and resources, Increase social support, Identify triggers associated with mental health/substance abuse issues and Increase skills for wellness and recovery  Therapeutic Interventions: Assess for all discharge needs, 1 to 1 time with Social worker, Explore available resources and support systems, Assess for adequacy in community support network, Educate family and significant other(s) on suicide prevention, Complete Psychosocial Assessment, Interpersonal group therapy.  Evaluation of Outcomes: Not Met   Progress in Treatment: Attending groups: No. Participating in groups: No. Taking medication as prescribed: Yes. Toleration medication: Yes. Family/Significant other contact made: No, will contact:  mother Patient understands diagnosis: No. Discussing patient identified problems/goals with staff:  Yes. Medical problems stabilized or resolved: Yes. Denies suicidal/homicidal ideation: Yes. Issues/concerns per patient self-inventory: Yes.  New problem(s) identified: Yes, Describe:  self harming behaviors, no income  New Short Term/Long Term Goal(s): detox, medication management for mood stabilization; elimination of SI thoughts; development of comprehensive mental wellness/sobriety plan.  Patient Goals:  Wants to discharge immediately   Discharge Plan or Barriers: Agreeable to follow up with [facility] and return home with mother and son  Reason for Continuation of Hospitalization: Anxiety Depression  Estimated Length of Stay: 3 days  Attendees: Patient: 10/22/2018 8:54 AM  Physician: Dr.Farah 10/22/2018 8:54 AM  Nursing: Chong Sicilian, RN 10/22/2018 8:54 AM  RN Care Manager: 10/22/2018 8:54 AM  Social Worker: Stephanie Acre, [place] 10/22/2018 8:54 AM  Recreational Therapist:  10/22/2018 8:54 AM  Other:  10/22/2018 8:54 AM  Other:  10/22/2018 8:54 AM  Other: 10/22/2018 8:54 AM    Scribe for Treatment Team: Joellen Jersey, [place] 10/22/2018 8:54 AM

## 2018-10-22 NOTE — Progress Notes (Signed)
D Pt is observed OOB UAL on the 300 hall today. He is depressed, he has a flat affect. HE wears his own clothes, that Probation officer observes him sleeping in. His hair is uncombed when he presents to the emd window first thing this morning.     A He is calm as he speaks to this Probation officer. He completed his daily assessment and on this he wrote he deneid SI today and he rated his deprression, hopelessness and anxiety " 0/0/2", respectively. When offerd his scheduled librium ( at breakfast , lunch and dinner, he flatly refused, saying " Im not having any withdrawal...honest..ibuprofen feel fine". He has spent most of his day, sleeping in his bed.      R Safety is in place.

## 2018-10-22 NOTE — [facility] Suicide Risk Assessment (Signed)
[facility] Admission Suicide Risk Assessment   Nursing information obtained from:  Patient, Review of record Demographic factors:  Male, Caucasian, Access to firearms Current Mental Status:  Suicidal ideation indicated by patient, Suicidal ideation indicated by others, Suicide plan, Plan includes specific time, place, or method, Self-harm thoughts, Self-harm behaviors Loss Factors:  Loss of significant relationship Historical Factors:  Prior suicide attempts Risk Reduction Factors:  Sense of responsibility to family, Living with another person, especially a relative, Responsible for children under 75 years of age  Total Time spent with patient: 45 minutes Principal Problem: Relapse on alcohol also abusing cannabis and benzodiazepines and threatening suicide while impaired Diagnosis:  Active Problems:   Alcohol abuse with alcohol-induced mood disorder ([place])  Subjective Data: Petition for intoxication and threats of suicide  Continued Clinical Symptoms:  Alcohol Use Disorder Identification Test Final Score (AUDIT): 9 The "Alcohol Use Disorders Identification Test", Guidelines for Use in Primary Care, Second Edition.  World Pharmacologist [facility]). Score between 0-7:  no or low risk or alcohol related problems. Score between 8-15:  moderate risk of alcohol related problems. Score between 16-19:  high risk of alcohol related problems. Score 20 or above:  warrants further diagnostic evaluation for alcohol dependence and treatment.   CLINICAL FACTORS:   Dysthymia Musculoskeletal: Strength & Muscle Tone: within normal limits Gait & Station: normal Patient leans: N/A  Psychiatric Specialty Exam: Physical Exam some abrasions on face hypertensive on exam  ROS neurological denies seizures or head trauma cardiovascular denies issues GI GU denies issues endocrine denies issues  Blood pressure (!) 142/91, pulse 64, temperature 98.3 F (36.8 C), resp. rate 18, height 6\' 1"  (1.854 m), weight 77.1 kg,  SpO2 98 %.Body mass index is 22.43 kg/m.  General Appearance: Casual  Eye Contact:  Good  Speech:  Clear and Coherent  Volume:  Normal  Mood:  Euthymic  Affect:  Congruent  Thought Process-generally coherent goal-directed with intact associations  Orientation:  Full (Time, Place, and Person)  Thought Content:  Logical  Suicidal Thoughts:  No  Homicidal Thoughts:  No  Memory:  Immediate;   Fair  Judgement:  Poor  Insight:  Shallow  Psychomotor Activity:  Normal  Concentration:  Concentration: Fair  Recall:  [employer] of Knowledge:  Fair  Language:  Fair  Akathisia:  Negative  Handed:  Right  AIMS (if indicated):     Assets:  Communication Skills Housing  ADL's:  Intact  Cognition:  WNL  Sleep:  Number of Hours: 6.75        COGNITIVE FEATURES THAT CONTRIBUTE TO RISK:  Polarized thinking    SUICIDE RISK:   Moderate:  Frequent suicidal ideation with limited intensity, and duration, some specificity in terms of plans, no associated intent, good self-control, limited dysphoria/symptomatology, some risk factors present, and identifiable protective factors, including available and accessible social support.  PLAN OF CARE: see eval  I certify that inpatient services furnished can reasonably be expected to improve the patient's condition.   Johnn Hai, MD 10/22/2018, 9:29 AM

## 2018-10-23 DIAGNOSIS — F1014 Alcohol abuse with alcohol-induced mood disorder: Secondary | ICD-10-CM

## 2018-10-23 NOTE — [facility] Group Notes (Signed)
[facility] Group Notes:  (Nursing)  Date:  10/23/2018  Time: 100 PM Type of Therapy:  Nurse Education  Participation Level:  Did Not Attend   Waymond Cera 10/23/2018, 3:01 PM

## 2018-10-23 NOTE — Plan of Care (Signed)
  Problem: Education: Goal: Emotional status will improve Outcome: Progressing   

## 2018-10-23 NOTE — Progress Notes (Signed)
D: Patient observed in dayroom this evening though somewhat isolative to self. Patient states, "I really don't know why I'm here other than my mom just doesn't want me to drink again. Even the doctor seemed to wonder why I was admitted. It was a one day thing, if that. I was never suicidal." Patient's affect anxious, mood congruent. Patient appears to minimize mother's concerns.  Denies pain, physical complaints. Denies withdrawal symptoms of any kind. COVID-19 screen negative, afebrile. Respiratory assessment WDL.  A: Medicated per orders, prn trazadone given for sleep. Patient continues to refuse librium stating he does not need it. Medication education provided. Level III obs in place for safety. Emotional support offered. Patient encouraged to complete Suicide Safety Plan before discharge. Encouraged to attend and participate in unit programming.  Fall prevention plan in place and reviewed with patient as pt is a high fall risk.   R: Patient verbalizes understanding of POC. On reassess, patient resting in bed. Patient denies SI/HI/AVH and remains safe on level III obs. Will continue to monitor throughout the night.

## 2018-10-23 NOTE — Progress Notes (Signed)
Patient's BP elevated this AM to which he attributes to anxiety about discharging. "No, I don't have high blood pressure, but I'm just anxious to go home. That's the only reason. Even the doctor yesterday said he was surprised I'm here. I'm really fine." Patient slightly shaky, anxious. Discussed option of AM librium and patient reluctant as he perceives it could delay his discharge. With education patient agreed to take. Patient also asking for neosporin for cuts to face. Will update day RN.

## 2018-10-23 NOTE — [facility] Group Notes (Signed)
[facility] Group Notes: (Clinical Social Work)   10/23/2018      Type of Therapy:  Group Therapy   Participation Level:  Did Not Attend - was invited both individually by MHT and by overhead announcement, chose not to attend.   Yutaka Holberg Grossman-Orr, LCSW 10/23/2018, 11:13 AM     

## 2018-10-23 NOTE — Progress Notes (Signed)
[facility] MD Progress Note  10/23/2018 9:04 AM [name]  MRN:  [id number] Subjective: Patient states "I am doing all right".  States "I only drank one day", currently denies withdrawal symptoms.  At this time presents future oriented and states "what I want to do is go home, and need to be with my family during this epidemic".  Denies suicidal ideations.  Objective: I reviewed chart notes and have met with patient. 33 year old male, presented intoxicated with blood alcohol level of 309.  Admitted under commitment reporting that patient has been drinking, not taking medications, making suicidal statements.  History of prior admissions, was admitted in May 2020,  alcohol intoxication, depression, suicide attempt by hanging. At this time patient is alert, attentive, presents vaguely anxious.  No tremors, no diaphoresis, no psychomotor agitation. Denies suicidal ideations and presents future oriented, hopeful for discharge soon in order to" see my family".  He also states he has a court date next week.  Patient has some abrasions on face-states this was related to a skateboarding accident.  In ED was worked up with CT scan (head, maxillofacial) reported as negative.  No disruptive behaviors on unit.  Currently denies medication side effects.   Principal Problem:  Alcohol Dependence, Alcohol Induced Mood Disorder Diagnosis: Active Problems:   Alcohol abuse with alcohol-induced mood disorder (HCC)  Total Time spent with patient: 15 minutes  Past Psychiatric History:   Past Medical History:  Past Medical History:  Diagnosis Date  . Anxiety   . OCD (obsessive compulsive disorder)    History reviewed. No pertinent surgical history. Family History: History reviewed. No pertinent family history. Family Psychiatric  History:  Social History:  Social History   Substance and Sexual Activity  Alcohol Use Yes     Social History   Substance and Sexual Activity  Drug Use Yes  . Types:  Marijuana    Social History   Socioeconomic History  . Marital status: Single    Spouse name: Not on file  . Number of children: Not on file  . Years of education: Not on file  . Highest education level: Not on file  Occupational History  . Not on file  Social Needs  . Financial resource strain: Not on file  . Food insecurity    Worry: Not on file    Inability: Not on file  . Transportation needs    Medical: Not on file    Non-medical: Not on file  Tobacco Use  . Smoking status: Current Every Day Smoker    Packs/day: 1.00    Types: Cigarettes  . Smokeless tobacco: Never Used  Substance and Sexual Activity  . Alcohol use: Yes  . Drug use: Yes    Types: Marijuana  . Sexual activity: Not on file  Lifestyle  . Physical activity    Days per week: Not on file    Minutes per session: Not on file  . Stress: Not on file  Relationships  . Social Herbalist on phone: Not on file    Gets together: Not on file    Attends religious service: Not on file    Active member of club or organization: Not on file    Attends meetings of clubs or organizations: Not on file    Relationship status: Not on file  Other Topics Concern  . Not on file  Social History Narrative  . Not on file   Additional Social History:   Sleep: Fair  Appetite:  Good  Current Medications: Current Facility-Administered Medications  Medication Dose Route Frequency Provider Last Rate Last Dose  . acetaminophen (TYLENOL) tablet 650 mg  650 mg Oral Q4H PRN Ethelene Hal, NP      . alum & mag hydroxide-simeth (MAALOX/MYLANTA) 200-200-20 MG/5ML suspension 30 mL  30 mL Oral Q6H PRN Ethelene Hal, NP      . chlordiazePOXIDE (LIBRIUM) capsule 25 mg  25 mg Oral Q6H PRN Johnn Hai, MD      . chlordiazePOXIDE (LIBRIUM) capsule 25 mg  25 mg Oral TID Johnn Hai, MD   25 mg at 10/23/18 1694   Followed by  . [START ON 10/24/2018] chlordiazePOXIDE (LIBRIUM) capsule 25 mg  25 mg Oral Celine Ahr, MD       Followed by  . [START ON 10/25/2018] chlordiazePOXIDE (LIBRIUM) capsule 25 mg  25 mg Oral Daily Johnn Hai, MD      . hydrOXYzine (ATARAX/VISTARIL) tablet 25 mg  25 mg Oral TID PRN Ethelene Hal, NP   25 mg at 10/22/18 1735  . magnesium hydroxide (MILK OF MAGNESIA) suspension 30 mL  30 mL Oral Daily PRN Ethelene Hal, NP      . nicotine (NICODERM CQ - dosed in mg/24 hours) patch 21 mg  21 mg Transdermal Daily , Myer Peer, MD   21 mg at 10/23/18 5038  . nicotine polacrilex (NICORETTE) gum 2 mg  2 mg Oral PRN Rozetta Nunnery, NP   2 mg at 10/21/18 2044  . ondansetron (ZOFRAN) tablet 4 mg  4 mg Oral Q8H PRN Ethelene Hal, NP      . thiamine (VITAMIN B-1) tablet 100 mg  100 mg Oral Daily Ethelene Hal, NP   100 mg at 10/22/18 8828   Or  . thiamine (B-1) injection 100 mg  100 mg Intravenous Daily Ethelene Hal, NP      . traZODone (DESYREL) tablet 50 mg  50 mg Oral QHS PRN Ethelene Hal, NP   50 mg at 10/22/18 2112  . traZODone (DESYREL) tablet 50 mg  50 mg Oral Once Rozetta Nunnery, NP        Lab Results: No results found for this or any previous visit (from the past 72 hour(s)).  Blood Alcohol level:  Lab Results  Component Value Date   ETH 329 (HH) 10/21/2018   ETH 409 (HH) 00/34/9179    Metabolic Disorder Labs: Lab Results  Component Value Date   HGBA1C 4.9 08/25/2018   MPG 93.93 08/25/2018   No results found for: PROLACTIN Lab Results  Component Value Date   CHOL 207 (H) 08/25/2018   TRIG 20 08/25/2018   HDL >135 08/25/2018   CHOLHDL NOT CALCULATED 08/25/2018   VLDL 4 08/25/2018   LDLCALC NOT CALCULATED 08/25/2018    Physical Findings: AIMS: Facial and Oral Movements Muscles of Facial Expression: None, normal Lips and Perioral Area: None, normal Jaw: None, normal Tongue: None, normal,Extremity Movements Upper (arms, wrists, hands, fingers): None, normal Lower (legs, knees, ankles, toes): None,  normal, Trunk Movements Neck, shoulders, hips: None, normal, Overall Severity Severity of abnormal movements (highest score from questions above): None, normal Incapacitation due to abnormal movements: None, normal Patient's awareness of abnormal movements (rate only patient's report): No Awareness, Dental Status Current problems with teeth and/or dentures?: No Does patient usually wear dentures?: No  CIWA:  CIWA-Ar Total: 4 COWS:     Musculoskeletal: Strength & Muscle Tone: within normal limits Gait & Station: normal  Patient leans: N/A  Psychiatric Specialty Exam: Physical Exam  ROS denies headache or chest pain, no shortness of breath, no vomiting , no fever, no chills   Blood pressure (!) 139/93, pulse (!) 59, temperature 97.9 F (36.6 C), temperature source Oral, resp. rate 18, height '6\' 1"'  (1.854 m), weight 77.1 kg, SpO2 98 %.Body mass index is 22.43 kg/m.  General Appearance: Fairly Groomed  Eye Contact:  Good  Speech:  Normal Rate  Volume:  Normal  Mood:  anxious , denies depression  Affect:  Congruent  Thought Process:  Linear and Descriptions of Associations: Intact  Orientation:  Full (Time, Place, and Person)  Thought Content:  No hallucinations, no delusions  Suicidal Thoughts:  No currently denies suicidal or self-injurious ideations  Homicidal Thoughts:  No  Memory:  Recent and remote grossly intact  Judgement:  Other:  Fair/  Insight:  Fair  Psychomotor Activity:  No tremors or diaphoresis, no psychomotor agitation  Concentration:  Concentration: Fair and Attention Span: Fair  Recall:  Good  Fund of Knowledge:  Good  Language:  Good  Akathisia:  Negative  Handed:  Right  AIMS (if indicated):     Assets:  Desire for Improvement Resilience  ADL's:  Intact  Cognition:  WNL  Sleep:  Number of Hours: 6.5   Assessment-  33 year old male, presented intoxicated with blood alcohol level of 309.  Admitted under commitment reporting that patient has been  drinking, not taking medications, making suicidal statements.  History of prior admissions, was admitted in May 2020,  alcohol intoxication, depression, suicide attempt by hanging.  Patient currently denies any suicidal ideations, and is focused on being discharged soon in order to reunite with his family.  He is not presenting with tremors, diaphoresis, and states he only drank x1 recently.  Mood is vaguely dysphoric and affect is anxious. . We discussed psychiatric medications-patient does not think he needs any standing psychiatric medication at this time states "I am doing all right"  Treatment Plan Summary: Daily contact with patient to assess and evaluate symptoms and progress in treatment, Medication management, Plan Inpatient treatment and Medications as below Encourage group and milieu participation Encourage efforts to work on sobriety/relapse prevention Continue alcohol detox protocol to address risk of alcohol withdrawal Continue nicotine patch to address nicotine withdrawal symptoms Continue trazodone 50 mg nightly PRN for insomnia Treatment team working on disposition planning options   Jenne Campus, MD 10/23/2018, 9:04 AM

## 2018-10-23 NOTE — BHH Suicide Risk Assessment (Signed)
[facility] INPATIENT:  Family/Significant Other Suicide Prevention Education  Suicide Prevention Education:  Contact Attempts: with mother, Kahner Yanik [phone] has been identified by the patient as the family member/significant other with whom the patient will be residing, and identified as the person(s) who will aid the patient in the event of a mental health crisis.  With written consent from the patient, two attempts were made to provide suicide prevention education, prior to and/or following the patient's discharge.  We were unsuccessful in providing suicide prevention education.  A suicide education pamphlet was given to the patient to share with family/significant other.  Date and time of first attempt: 10/22/2018 at 10:35am. HIPAA compliant voicemail left with callback information. Date and time of second attempt: 10/23/2018 at 8:48am. A second voicemail was left.  SPE will be reviewed with patient.  Joellen Jersey 10/23/2018, 8:48 AM

## 2018-10-23 NOTE — Progress Notes (Signed)
[place] NOVEL CORONAVIRUS (COVID-19) DAILY CHECK-OFF SYMPTOMS - answer yes or no to each - every day NO YES  Have you had a fever in the past 24 hours?  . Fever (Temp > 37.80C / 100F) X   Have you had any of these symptoms in the past 24 hours? . New Cough .  Sore Throat  .  Shortness of Breath .  Difficulty Breathing .  Unexplained Body Aches   X   Have you had any one of these symptoms in the past 24 hours not related to allergies?   . Runny Nose .  Nasal Congestion .  Sneezing   X   If you have had runny nose, nasal congestion, sneezing in the past 24 hours, has it worsened?  X   EXPOSURES - check yes or no X   Have you traveled outside the state in the past 14 days?  X   Have you been in contact with someone with a confirmed diagnosis of COVID-19 or PUI in the past 14 days without wearing appropriate PPE?  X   Have you been living in the same home as a person with confirmed diagnosis of COVID-19 or a PUI (household contact)?    X   Have you been diagnosed with COVID-19?    X              What to do next: Answered NO to all: Answered YES to anything:   Proceed with unit schedule Follow the [facility] Inpatient Flowsheet.   

## 2018-10-23 NOTE — [facility] Suicide Risk Assessment (Signed)
[place] INPATIENT:  Family/Significant Other Suicide Prevention Education  Suicide Prevention Education:  Education Completed; [name] [phone] has been identified by the patient as the family member/significant other with whom the patient will be residing, and identified as the person(s) who will aid the patient in the event of a mental health crisis (suicidal ideations/suicide attempt).  With written consent from the patient, the family member/significant other has been provided the following suicide prevention education, prior to the and/or following the discharge of the patient.  The suicide prevention education provided includes the following:  Suicide risk factors  Suicide prevention and interventions  National Suicide Hotline telephone number  [facility] assessment telephone number  [facility] Emergency Assistance [place] and/or Residential Mobile Crisis Unit telephone number  Request made of family/significant other to:  Remove weapons (e.g., guns, rifles, knives), all items previously/currently identified as safety concern.    Remove drugs/medications (over-the-counter, prescriptions, illicit drugs), all items previously/currently identified as a safety concern.  The family member/significant other verbalizes understanding of the suicide prevention education information provided.  The family member/significant other agrees to remove the items of safety concern listed above.  Mother shared that she was concerned when she picked up the patient from the train station and he was heavily intoxicated.   Mother shares that since the patient's last [facility] admission, he has continued to drink alcohol, but has cut back. She confirms the patient is not compliant with medications or outpatient therapy. Mother attributes this to the patient not liking telemedicine services and that the patient has told her the medications do not feel like they are  helpful.  Mother does not have any specific safety concerns for the patient other than wanting him to be "okay," she is agreeable to him discharging over the weekend and returning home.  [name] 10/23/2018, 9:26 AM

## 2018-10-23 NOTE — Progress Notes (Signed)
D Pt is observed coming back from breakfast this am. HE endorses a flat, depressed affect. He is quiet and depressed. He wears his own clothes, his hair is uncombed. He is in denial of his illness as evidenced by his statement " I dont need that medicine".     A He did complete his daily assessment and on this he wrote he deneid SI today and he rated his depression, hopelessness and anxiety " 5/5/5"< respectively. He is encouraged to take part in his therapies.     R Safety is in place.

## 2018-10-24 MED ORDER — TRAZODONE HCL 50 MG PO TABS: 50.0000 mg | ORAL_TABLET | Freq: Every evening | ORAL | 0 refills | Status: DC | PRN

## 2018-10-24 NOTE — Progress Notes (Signed)
[place] NOVEL CORONAVIRUS (COVID-19) DAILY CHECK-OFF SYMPTOMS - answer yes or no to each - every day NO YES  Have you had a fever in the past 24 hours?  . Fever (Temp > 37.80C / 100F) X   Have you had any of these symptoms in the past 24 hours? . New Cough .  Sore Throat  .  Shortness of Breath .  Difficulty Breathing .  Unexplained Body Aches   X   Have you had any one of these symptoms in the past 24 hours not related to allergies?   . Runny Nose .  Nasal Congestion .  Sneezing   X   If you have had runny nose, nasal congestion, sneezing in the past 24 hours, has it worsened?  X   EXPOSURES - check yes or no X   Have you traveled outside the state in the past 14 days?  X   Have you been in contact with someone with a confirmed diagnosis of COVID-19 or PUI in the past 14 days without wearing appropriate PPE?  X   Have you been living in the same home as a person with confirmed diagnosis of COVID-19 or a PUI (household contact)?    X   Have you been diagnosed with COVID-19?    X              What to do next: Answered NO to all: Answered YES to anything:   Proceed with unit schedule Follow the [facility] Inpatient Flowsheet.   

## 2018-10-24 NOTE — Progress Notes (Signed)
Per poc, pt completed his daily assessment this am. On this, he wrote he deneid having any SI today and he rated his depression, hopelessness and anxeity " 0/0/0/", respectively. DC teaching with patient is completed by this nurse. Patient  stated verbal understanding and willingness to comply. He is nervous, anxious and he says" I really want this to work". He is given all cc of dc instructions by this writer ( SRA, AVS, SSP and transition record) along with his prescription for his nightly trazadone and all belongings in his locker are returned to him per policy. He was escorted to bldg entrance and dc'd ambulatory to his own care- per MD order.

## 2018-10-24 NOTE — Progress Notes (Signed)
D: Patient observed up and visible on unit. Patient states he just wants to discharge to get home to his 35 year old. Becomes tearful talking about him. Patient's affect anxious with congruent mood. Denies pain, physical complaints. No withdrawal reported. COVID-19 screen negative, afebrile. Respiratory assessment WDL.  A: Medicated per orders, prn trazadone given for sleep. Medication education provided. Level III obs in place for safety. Emotional support offered. Patient encouraged to complete Suicide Safety Plan before discharge. Encouraged to attend and participate in unit programming.  R: Patient verbalizes understanding of POC. On reassess, patient resting in bed. Patient denies SI/HI/AVH and remains safe on level III obs. Will continue to monitor throughout the night.

## 2018-10-24 NOTE — Plan of Care (Signed)
?  Problem: Education: ?Goal: Knowledge of [place] [facility] Education information/materials will improve ?Outcome: Adequate for Discharge ?Goal: Emotional status will improve ?Outcome: Adequate for Discharge ?Goal: Mental status will improve ?Outcome: Adequate for Discharge ?Goal: Verbalization of understanding the information provided will improve ?Outcome: Adequate for Discharge ?  ?Problem: Activity: ?Goal: Interest or engagement in activities will improve ?Outcome: Adequate for Discharge ?Goal: Sleeping patterns will improve ?Outcome: Adequate for Discharge ?  ?Problem: Coping: ?Goal: Ability to verbalize frustrations and anger appropriately will improve ?Outcome: Adequate for Discharge ?Goal: Ability to demonstrate self-control will improve ?Outcome: Adequate for Discharge ?  ?Problem: Health Behavior/Discharge Planning: ?Goal: Identification of resources available to assist in meeting health care needs will improve ?Outcome: Adequate for Discharge ?Goal: Compliance with treatment plan for underlying cause of condition will improve ?Outcome: Adequate for Discharge ?  ?Problem: Physical Regulation: ?Goal: Ability to maintain clinical measurements within normal limits will improve ?Outcome: Adequate for Discharge ?  ?Problem: Safety: ?Goal: Periods of time without injury will increase ?Outcome: Adequate for Discharge ?  ?Problem: Education: ?Goal: Knowledge of disease or condition will improve ?Outcome: Adequate for Discharge ?Goal: Understanding of discharge needs will improve ?Outcome: Adequate for Discharge ?  ?Problem: Health Behavior/Discharge Planning: ?Goal: Ability to identify changes in lifestyle to reduce recurrence of condition will improve ?Outcome: Adequate for Discharge ?Goal: Identification of resources available to assist in meeting health care needs will improve ?Outcome: Adequate for Discharge ?  ?Problem: Physical Regulation: ?Goal: Complications related to the disease process, condition or  treatment will be avoided or minimized ?Outcome: Adequate for Discharge ?  ?Problem: Safety: ?Goal: Ability to remain free from injury will improve ?Outcome: Adequate for Discharge ?  ?

## 2018-10-24 NOTE — [facility] Suicide Risk Assessment (Signed)
[facility]   Principal Problem: Alcohol Induced Mood Disorder  Discharge Diagnoses: Active Problems:   Alcohol abuse with alcohol-induced mood disorder (HCC)   Total Time spent with patient: 30 minutes  Musculoskeletal: Strength & Muscle Tone: within normal limits- no tremors, no diaphoresis, no restlessness or agitation Gait & Station: normal Patient leans: N/A  Psychiatric Specialty Exam: ROS no headache, no chest pain, no shortness of breath, no cough  Blood pressure (!) 134/93, pulse 65, temperature 98.2 F (36.8 C), temperature source Oral, resp. rate 20, height 6\' 1"  (1.854 m), weight 77.1 kg, SpO2 100 %.Body mass index is 22.43 kg/m.  General Appearance: improving grooming   Eye Contact::  Good  Speech:  Normal Rate409  Volume:  Normal  Mood:  reports improved mood and denies feeling depressed . States mood is 9/10 with 10 being best   Affect:  appropriate, reactive, vaguely anxious  Thought Process:  Linear and Descriptions of Associations: Intact  Orientation:  Full (Time, Place, and Person)  Thought Content:  denies hallucinations, no delusions , not internally preoccupied  Suicidal Thoughts:  No denies any suicidal or self injurious ideations, denies homicidal or violent ideations  Homicidal Thoughts:  No  Memory:  recent and remote grossly intact   Judgement:  Other:  improving   Insight:  improving   Psychomotor Activity:  Normal  Concentration:  Good  Recall:  Good  Fund of Knowledge:Good  Language: Good  Akathisia:  Negative  Handed:  Right  AIMS (if indicated):     Assets:  Desire for Improvement Resilience  Sleep:  Number of Hours: 5.5  Cognition: WNL  ADL's:  Intact   Mental Status Per Nursing Assessment::   On Admission:  Suicidal ideation indicated by patient, Suicidal ideation indicated by others, Suicide plan, Plan includes specific time, place, or method, Self-harm thoughts, Self-harm behaviors  Demographic Factors:   [age], lives with mother, has a 7515 y old son  Loss Factors: Alcohol relapse   Historical Factors: History of alcohol use disorder, history of prior psychiatric admissions   Risk Reduction Factors:   Responsible for children under 35 years of age, Sense of responsibility to family, Living with another person, especially a relative and Positive coping skills or problem solving skills  Continued Clinical Symptoms:  Alert, attentive, calm, mood is described as improved and denies feeling depressed, affect is appropriate, reactive, mildly anxious , no thought disorder, no SI or HI, no psychotic symptoms, future oriented, looking forward to reunite with son and mother.  No withdrawal symptoms. No medication side effects. Currently not on standing psychiatric medication- not currently interested in Campral or Naltrexone trial. Behavior on unit in good control, pleasant on approach. With his express consent I spoke with mother on phone, who corroborates patient is improved and is in agreement with discharge today.  Cognitive Features That Contribute To Risk:  No gross cognitive deficits noted upon discharge. Is alert , attentive, and oriented x 3   Suicide Risk:  Mild:  Suicidal ideation of limited frequency, intensity, duration, and specificity.  There are no identifiable plans, no associated intent, mild dysphoria and related symptoms, good self-control (both objective and subjective assessment), few other risk factors, and identifiable protective factors, including available and accessible social support.  Follow-up Information    [facility] Follow up on 10/29/2018.   Why: Telephonic hospital follow up appointment is Friday, 7/17 at 9:30a.  The provider will contact you.  Contact information: [address] [place]  [address] [phone]           Plan Of Care/Follow-up recommendations:  Activity:  inpatient treatment Diet:  regular Tests:  NA Other:  see below  Patient is  leaving unit in good spirits, plans to return home. We reviewed importance of avoiding people, places and situations associated with alcohol use in order to decrease triggers for relapse and encouraged patient to consider AA attendance.  Jenne Campus, MD 10/24/2018, 8:24 AM

## 2018-10-24 NOTE — [facility] Group Notes (Signed)
[facility] LCSW Group Therapy Note  Date/Time:  10/24/2018  9:30AM-10:15PM  Type of Therapy and Topic:  Group Therapy:  Music and Relapse Prevention  Participation Level:  Active   Description of Group: In this process group, members discussed what types of music trigger them to drug or alcohol use and what they can do about this in the future.  For instance, we discussed what to do when riding in a friend's car and a song harmful to the patient starts playing.  We also discussed how music can be used as a tool to help with wellness and recovery in various ways including managing depression and anxiety as well as encouraging healthy sleep habits and avoiding relapse.  Three songs were played as examples, including "Breaking Down," "Wine Into Water," and "Brand New Me."  Comments were elicited which showed the group to be in agreement that the songs were quite relatable and have the potential to help them out of the hospital setting.  Therapeutic Goals: 1. Patients will be introduced to three specific songs that can relate to addictions in a helpful way. 2. Patients will explore the impact of different pieces of music on their feelings, i.e. uplifting, triggering, etc. 3. Patients will discuss how to use this self-knowledge to assist in preventing relapse.  Summary of Patient Progress:  At the beginning of group, patient expressed that some rock music and some of today's pop music that is based on previous rock songs can trigger him to drinking.  He was quite emotional throughout group with the various songs as well as with other patients' stories as they were being told.  Therapeutic Modalities: Solution Focused Brief Therapy Activity   Selmer Dominion, LCSW

## 2018-10-24 NOTE — Discharge Summary (Addendum)
Physician Discharge Summary Note  Patient:  [name] is an 33 y.o., male MRN:  [id number] DOB:  Sep 18, 1985 Patient phone:  [phone] (home)  Patient address:   [place] [address],  Total Time spent with patient: 15 minutes  Date of Admission:  10/21/2018 Date of Discharge: 10/24/2018  Reason for Admission: Per assessment note: [name] is 33 years of age he presented intoxicated with a blood alcohol level of 309 and he was petition for involuntary commitment for making suicidal statements.  His history is inconsistent he told me he just relapsed 1 day "got drunk on a train" coming back from seeing his girlfriend from [place] and his mother was worried and petitioned him simply to stop drinking.  However he denies suicidal thoughts states he did not make such statements he tells me his face is scratched up because of a "skateboarding accident" he told other examiners that he banged his head opening a door, further drug screen showed cannabis and benzodiazepines though he denied any other substance abuse.Thus because his story is inconsistent he has a history of attempting to hang himself while impaired, we will go ahead and monitor him through the weekend to monitor for withdrawal gather collateral history again so forth.  Principal Problem: Alcohol abuse with alcohol-induced mood disorder [facility]) Discharge Diagnoses: Principal Problem:   Alcohol abuse with alcohol-induced mood disorder [facility])   Past Psychiatric History:   Past Medical History:  Past Medical History:  Diagnosis Date  . Anxiety   . OCD (obsessive compulsive disorder)    History reviewed. No pertinent surgical history. Family History: History reviewed. No pertinent family history. Family Psychiatric  History:  Social History:  Social History   Substance and Sexual Activity  Alcohol Use Yes     Social History   Substance and Sexual Activity  Drug Use Yes  . Types: Marijuana     Social History   Socioeconomic History  . Marital status: Single    Spouse name: Not on file  . Number of children: Not on file  . Years of education: Not on file  . Highest education level: Not on file  Occupational History  . Not on file  Social Needs  . Financial resource strain: Not on file  . Food insecurity    Worry: Not on file    Inability: Not on file  . Transportation needs    Medical: Not on file    Non-medical: Not on file  Tobacco Use  . Smoking status: Current Every Day Smoker    Packs/day: 1.00    Types: Cigarettes  . Smokeless tobacco: Never Used  Substance and Sexual Activity  . Alcohol use: Yes  . Drug use: Yes    Types: Marijuana  . Sexual activity: Not on file  Lifestyle  . Physical activity    Days per week: Not on file    Minutes per session: Not on file  . Stress: Not on file  Relationships  . Social Herbalist on phone: Not on file    Gets together: Not on file    Attends religious service: Not on file    Active member of club or organization: Not on file    Attends meetings of clubs or organizations: Not on file    Relationship status: Not on file  Other Topics Concern  . Not on file  Social History Narrative  . Not on file    Hospital Course:  [name] was  admitted for Alcohol abuse with alcohol-induced mood disorder (HCC)  and crisis management.  Pt was treated discharged with the medications listed below under Medication List.  Medical problems were identified and treated as needed.  Home medications were restarted as appropriate.  Improvement was monitored by observation and [name] 's daily report of symptom reduction.  Emotional and mental status was monitored by daily self-inventory reports completed by [name] and clinical staff.         [name] was evaluated by the treatment team for stability and plans for continued recovery upon discharge. [name] 's motivation  was an integral factor for scheduling further treatment. Employment, transportation, bed availability, health status, family support, and any pending legal issues were also considered during hospital stay. Pt was offered further treatment options upon discharge including but not limited to Residential, Intensive Outpatient, and Outpatient treatment.  [name] will follow up with the services as listed below under Follow Up Information.     Upon completion of this admission the patient was both mentally and medically stable for discharge denying suicidal/homicidal ideation, auditory/visual/tactile hallucinations, delusional thoughts and paranoia.    [name] responded well to treatment with Trazdone 50 mg and, Alcohol Detox Protocol. Librium/ Ativan   daily group sessions, and without adverse effects. Pt demonstrated improvement without reported or observed adverse effects to the point of stability appropriate for outpatient management. Pertinent labs include:for which outpatient follow-up is necessary for lab recheck as mentioned below. Reviewed CBC, CMP, BAL 329, and UDS; all unremarkable aside from noted exceptions.   Physical Findings: AIMS: Facial and Oral Movements Muscles of Facial Expression: None, normal Lips and Perioral Area: None, normal Jaw: None, normal Tongue: None, normal,Extremity Movements Upper (arms, wrists, hands, fingers): None, normal Lower (legs, knees, ankles, toes): None, normal, Trunk Movements Neck, shoulders, hips: None, normal, Overall Severity Severity of abnormal movements (highest score from questions above): None, normal Incapacitation due to abnormal movements: None, normal Patient's awareness of abnormal movements (rate only patient's report): No Awareness, Dental Status Current problems with teeth and/or dentures?: No Does patient usually wear dentures?: No  CIWA:  CIWA-Ar Total: 5 COWS:     Musculoskeletal: Strength & Muscle Tone:  within normal limits Gait & Station: normal Patient leans: N/A  Psychiatric Specialty Exam: See SRA by MD  Physical Exam  Review of Systems  Psychiatric/Behavioral: Positive for substance abuse. Negative for depression and suicidal ideas. The patient has insomnia (improvement with trazdone).   All other systems reviewed and are negative.   Blood pressure (!) 134/93, pulse 65, temperature 98.2 F (36.8 C), temperature source Oral, resp. rate 20, height 6\' 1"  (1.854 m), weight 77.1 kg, SpO2 100 %.Body mass index is 22.43 kg/m.   Have you used any form of tobacco in the last 30 days? (Cigarettes, Smokeless Tobacco, Cigars, and/or Pipes): Yes  Has this patient used any form of tobacco in the last 30 days? (Cigarettes, Smokeless Tobacco, Cigars, and/or Pipes) Yes, Yes, A prescription for an FDA-approved tobacco cessation medication was offered at discharge and the patient refused  Blood Alcohol level:  Lab Results  Component Value Date   ETH 329 (HH) 10/21/2018   ETH 409 (HH) 08/24/2018    Metabolic Disorder Labs:  Lab Results  Component Value Date   HGBA1C 4.9 08/25/2018   MPG 93.93 08/25/2018   No results found for: PROLACTIN Lab Results  Component Value Date   CHOL 207 (H) 08/25/2018   TRIG 20 08/25/2018   HDL >  135 08/25/2018   CHOLHDL NOT CALCULATED 08/25/2018   VLDL 4 08/25/2018   LDLCALC NOT CALCULATED 08/25/2018    See Psychiatric Specialty Exam and Suicide Risk Assessment completed by Attending Physician prior to discharge.  Discharge destination:  Home  Is patient on multiple antipsychotic therapies at discharge:  No   Has Patient had three or more failed trials of antipsychotic monotherapy by history:  No  Recommended Plan for Multiple Antipsychotic Therapies: NA  Discharge Instructions    Diet - low sodium heart healthy   Complete by: As directed    Discharge instructions   Complete by: As directed    Take all medications as prescribed. Keep all  follow-up appointments as scheduled.  Do not consume alcohol or use illegal drugs while on prescription medications. Report any adverse effects from your medications to your primary care provider promptly.  In the event of recurrent symptoms or worsening symptoms, call 911, a crisis hotline, or go to the nearest emergency department for evaluation.   Increase activity slowly   Complete by: As directed      Allergies as of 10/24/2018   No Known Allergies     Medication List    STOP taking these medications   acamprosate 333 MG tablet Commonly known as: CAMPRAL     TAKE these medications     Indication  nicotine 21 mg/24hr patch Commonly known as: NICODERM CQ - dosed in mg/24 hours Place 1 patch (21 mg total) onto the skin daily.  Indication: Nicotine Addiction   traZODone 50 MG tablet Commonly known as: DESYREL Take 1 tablet (50 mg total) by mouth at bedtime as needed for sleep.  Indication: Trouble Sleeping      Follow-up Information    [facility] Follow up on 10/29/2018.   Why: Telephonic hospital follow up appointment is Friday, 7/17 at 9:30a.  The provider will contact you.  Contact information: [address] [place] [id number] [phone]           Follow-up recommendations:  Activity:  as tolerated Diet:  heart healthy  Comments:  Take all medications as prescribed. Keep all follow-up appointments as scheduled.  Do not consume alcohol or use illegal drugs while on prescription medications. Report any adverse effects from your medications to your primary care provider promptly.  In the event of recurrent symptoms or worsening symptoms, call 911, a crisis hotline, or go to the nearest emergency department for evaluation.   Signed: Oneta Rackanika N Lewis, NP 10/24/2018, 10:29 AM   Patient seen, Suicide Assessment Completed.  Disposition Plan Reviewed

## 2018-10-24 NOTE — Progress Notes (Signed)
  [facility] Adult Case Management Discharge Plan :  Will you be returning to the same living situation after discharge:  Yes,  with mother and son At discharge, do you have transportation home?: Yes,  family Do you have the ability to pay for your medications: No.  Release of information consent forms completed and turned in to Medical Records by CSW.   Patient to Follow up at: Follow-up Information    [facility] Follow up on 10/29/2018.   Why: Telephonic hospital follow up appointment is Friday, 7/17 at 9:30a.  The provider will contact you.  Contact information: [address] [place] [phone]           Next level of care provider has access to [place] and Suicide Prevention discussed: Yes,  with patient's mother  Have you used any form of tobacco in the last 30 days? (Cigarettes, Smokeless Tobacco, Cigars, and/or Pipes): Yes  Has patient been referred to the Quitline?: Patient refused referral  Patient has been referred for addiction treatment: Yes  Maretta Los, LCSW 10/24/2018, 8:35 AM

## 2019-02-14 ENCOUNTER — Encounter (HOSPITAL_COMMUNITY): Payer: Self-pay | Admitting: Emergency Medicine

## 2019-02-14 ENCOUNTER — Other Ambulatory Visit: Payer: Self-pay

## 2019-02-14 ENCOUNTER — Emergency Department (HOSPITAL_COMMUNITY)
Admission: EM | Admit: 2019-02-14 | Discharge: 2019-02-15 | Disposition: A | Discharge status: Other Acute Inpatient Hospital | Payer: Self-pay | Attending: Emergency Medicine | Admitting: Emergency Medicine

## 2019-02-14 DIAGNOSIS — F429 Obsessive-compulsive disorder, unspecified: Secondary | ICD-10-CM | POA: Insufficient documentation

## 2019-02-14 DIAGNOSIS — R45851 Suicidal ideations: Secondary | ICD-10-CM | POA: Insufficient documentation

## 2019-02-14 DIAGNOSIS — Z20828 Contact with and (suspected) exposure to other viral communicable diseases: Secondary | ICD-10-CM | POA: Insufficient documentation

## 2019-02-14 DIAGNOSIS — Z79899 Other long term (current) drug therapy: Secondary | ICD-10-CM | POA: Insufficient documentation

## 2019-02-14 DIAGNOSIS — F419 Anxiety disorder, unspecified: Secondary | ICD-10-CM | POA: Insufficient documentation

## 2019-02-14 DIAGNOSIS — F17210 Nicotine dependence, cigarettes, uncomplicated: Secondary | ICD-10-CM | POA: Insufficient documentation

## 2019-02-14 DIAGNOSIS — F1914 Other psychoactive substance abuse with psychoactive substance-induced mood disorder: Secondary | ICD-10-CM | POA: Insufficient documentation

## 2019-02-14 DIAGNOSIS — F332 Major depressive disorder, recurrent severe without psychotic features: Secondary | ICD-10-CM | POA: Insufficient documentation

## 2019-02-14 DIAGNOSIS — F10920 Alcohol use, unspecified with intoxication, uncomplicated: Secondary | ICD-10-CM | POA: Insufficient documentation

## 2019-02-14 NOTE — ED Triage Notes (Signed)
Pt from home lives with mother and his 35  Year old son Has hx of depression anxiety SI and ETOH abuse. Pt verbalized to mother wanting to kill himself and placed a knife at his own wrist. [employer] called to the home and officers convinced Mr [name] to come to the hospital for evaluation after his mother Arty Lantzy IVC'd him.

## 2019-02-14 NOTE — ED Provider Notes (Signed)
[facility]-EMERGENCY DEPT Provider Note   CSN: [id number] Arrival date & time: 02/14/19  2254     History   Chief Complaint Chief Complaint  Patient presents with  . Suicidal  . Alcohol Problem    HPI [name] is a 33 y.o. male.     Patient brought to the emergency department for psychiatric evaluation.  He is under involuntary commitment by family members.  Patient reports that he has been having a lot of increased anxiety recently.  He reportedly told family members that he was going to kill himself tonight, precipitating the IVC.  Patient has been drinking tonight and is clearly intoxicated.  He reports that he does not normally drink, however.     Past Medical History:  Diagnosis Date  . Anxiety   . OCD (obsessive compulsive disorder)     Patient Active Problem List   Diagnosis Date Noted  . Alcohol abuse with alcohol-induced mood disorder (HCC) 10/21/2018  . MDD (major depressive disorder), recurrent severe, without psychosis (HCC) 08/24/2018  . Alcohol use disorder, severe, dependence (HCC) 08/24/2018    History reviewed. No pertinent surgical history.      Home Medications    Prior to Admission medications   Medication Sig Start Date End Date Taking? Authorizing Provider  nicotine (NICODERM CQ - DOSED IN MG/24 HOURS) 21 mg/24hr patch Place 1 patch (21 mg total) onto the skin daily. Patient not taking: Reported on 10/21/2018 08/28/18   Aldean Baker, NP  traZODone (DESYREL) 50 MG tablet Take 1 tablet (50 mg total) by mouth at bedtime as needed for sleep. 10/24/18   Oneta Rack, NP    Family History History reviewed. No pertinent family history.  Social History Social History   Tobacco Use  . Smoking status: Current Every Day Smoker    Packs/day: 1.00    Types: Cigarettes  . Smokeless tobacco: Never Used  Substance Use Topics  . Alcohol use: Yes  . Drug use: Yes    Types: Marijuana     Allergies   Patient has  no known allergies.   Review of Systems Review of Systems  Psychiatric/Behavioral: Positive for suicidal ideas. The patient is nervous/anxious.   All other systems reviewed and are negative.    Physical Exam Updated Vital Signs BP 121/86 (BP Location: Left Arm)   Pulse 87   Temp 98.4 F (36.9 C) (Oral)   Resp 18   SpO2 98%   Physical Exam Vitals signs and nursing note reviewed.  Constitutional:      General: He is not in acute distress.    Appearance: Normal appearance. He is well-developed.  HENT:     Head: Normocephalic and atraumatic.     Right Ear: Hearing normal.     Left Ear: Hearing normal.     Nose: Nose normal.  Eyes:     Conjunctiva/sclera: Conjunctivae normal.     Pupils: Pupils are equal, round, and reactive to light.  Neck:     Musculoskeletal: Normal range of motion and neck supple.  Cardiovascular:     Rate and Rhythm: Regular rhythm.     Heart sounds: S1 normal and S2 normal. No murmur. No friction rub. No gallop.   Pulmonary:     Effort: Pulmonary effort is normal. No respiratory distress.     Breath sounds: Normal breath sounds.  Chest:     Chest wall: No tenderness.  Abdominal:     General: Bowel sounds are normal.  Palpations: Abdomen is soft.     Tenderness: There is no abdominal tenderness. There is no guarding or rebound. Negative signs include Murphy's sign and McBurney's sign.     Hernia: No hernia is present.  Musculoskeletal: Normal range of motion.  Skin:    General: Skin is warm and dry.     Findings: No rash.  Neurological:     Mental Status: He is alert and oriented to person, place, and time.     GCS: GCS eye subscore is 4. GCS verbal subscore is 5. GCS motor subscore is 6.     Cranial Nerves: No cranial nerve deficit.     Sensory: No sensory deficit.     Coordination: Coordination normal.  Psychiatric:        Speech: Speech is slurred.      ED Treatments / Results  Labs (all labs ordered are listed, but only abnormal  results are displayed) Labs Reviewed  CBC  COMPREHENSIVE METABOLIC PANEL  RAPID URINE DRUG SCREEN, HOSP PERFORMED  ETHANOL    EKG None  Radiology No results found.  Procedures Procedures (including critical care time)  Medications Ordered in ED Medications - No data to display   Initial Impression / Assessment and Plan / ED Course  I have reviewed the triage vital signs and the nursing notes.  Pertinent labs & imaging results that were available during my care of the patient were reviewed by me and considered in my medical decision making (see chart for details).        Patient presents under involuntary commitment because of suicidal ideation.  He is currently intoxicated.  He will likely need to be monitored for a while until he is sober enough for psychiatric evaluation.  Will consult TTS when appropriate.  Patient is otherwise medically clear for psychiatric evaluation.  Final Clinical Impressions(s) / ED Diagnoses   Final diagnoses:  Suicidal thoughts  Alcoholic intoxication without complication [facility])    ED Discharge Orders    None       Betsey Holiday Gwenyth Allegra, MD 02/14/19 [phone]

## 2019-02-15 ENCOUNTER — Inpatient Hospital Stay (HOSPITAL_COMMUNITY)
Admission: AD | Admit: 2019-02-15 | Discharge: 2019-02-17 | DRG: 897 | Disposition: A | Discharge status: Home / Self Care | Payer: No Typology Code available for payment source | Source: Intra-hospital | Attending: Psychiatry | Admitting: Psychiatry

## 2019-02-15 ENCOUNTER — Encounter (HOSPITAL_COMMUNITY): Payer: Self-pay

## 2019-02-15 ENCOUNTER — Other Ambulatory Visit: Payer: Self-pay

## 2019-02-15 DIAGNOSIS — Y908 Blood alcohol level of 240 mg/100 ml or more: Secondary | ICD-10-CM | POA: Diagnosis present

## 2019-02-15 DIAGNOSIS — Z20828 Contact with and (suspected) exposure to other viral communicable diseases: Secondary | ICD-10-CM | POA: Diagnosis present

## 2019-02-15 DIAGNOSIS — F1020 Alcohol dependence, uncomplicated: Secondary | ICD-10-CM | POA: Diagnosis not present

## 2019-02-15 DIAGNOSIS — R45851 Suicidal ideations: Secondary | ICD-10-CM | POA: Diagnosis present

## 2019-02-15 DIAGNOSIS — F1024 Alcohol dependence with alcohol-induced mood disorder: Secondary | ICD-10-CM | POA: Diagnosis present

## 2019-02-15 DIAGNOSIS — F1014 Alcohol abuse with alcohol-induced mood disorder: Secondary | ICD-10-CM | POA: Diagnosis present

## 2019-02-15 DIAGNOSIS — G4700 Insomnia, unspecified: Secondary | ICD-10-CM | POA: Diagnosis present

## 2019-02-15 DIAGNOSIS — F17210 Nicotine dependence, cigarettes, uncomplicated: Secondary | ICD-10-CM | POA: Diagnosis present

## 2019-02-15 LAB — COMPREHENSIVE METABOLIC PANEL
ALT: 30 U/L (ref 0–44)
AST: 39 U/L (ref 15–41)
Albumin: 4.2 g/dL (ref 3.5–5.0)
Alkaline Phosphatase: 61 U/L (ref 38–126)
Anion gap: 13 (ref 5–15)
BUN: 6 mg/dL (ref 6–20)
CO2: 22 mmol/L (ref 22–32)
Calcium: 8.8 mg/dL — ABNORMAL LOW (ref 8.9–10.3)
Chloride: 109 mmol/L (ref 98–111)
Creatinine, Ser: 0.61 mg/dL (ref 0.61–1.24)
GFR calc Af Amer: >60 mL/min (ref >60)
GFR calc non Af Amer: >60 mL/min (ref >60)
Glucose, Bld: 95 mg/dL (ref 70–99)
Potassium: 3.7 mmol/L (ref 3.5–5.1)
Sodium: 144 mmol/L (ref 135–145)
Total Bilirubin: 0.7 mg/dL (ref 0.3–1.2)
Total Protein: 7.4 g/dL (ref 6.5–8.1)

## 2019-02-15 LAB — SARS CORONAVIRUS 2 BY RT PCR (HOSPITAL ORDER, PERFORMED IN ~~LOC~~ HOSPITAL LAB): SARS Coronavirus 2: NEGATIVE

## 2019-02-15 LAB — RAPID URINE DRUG SCREEN, HOSP PERFORMED
Amphetamines: NOT DETECTED
Barbiturates: NOT DETECTED
Benzodiazepines: POSITIVE — AB
Cocaine: NOT DETECTED
Opiates: NOT DETECTED
Tetrahydrocannabinol: POSITIVE — AB

## 2019-02-15 LAB — CBC
HCT: 48.2 % (ref 39.0–52.0)
Hemoglobin: 16.5 g/dL (ref 13.0–17.0)
MCH: 35.7 pg — ABNORMAL HIGH (ref 26.0–34.0)
MCHC: 34.2 g/dL (ref 30.0–36.0)
MCV: 104.3 fL — ABNORMAL HIGH (ref 80.0–100.0)
Platelets: 181 10*3/uL (ref 150–400)
RBC: 4.62 MIL/uL (ref 4.22–5.81)
RDW: 12.4 % (ref 11.5–15.5)
WBC: 5.1 10*3/uL (ref 4.0–10.5)
nRBC: 0 % (ref 0.0–0.2)

## 2019-02-15 LAB — ETHANOL: Alcohol, Ethyl (B): 328 mg/dL (ref <10)

## 2019-02-15 MED ORDER — HYDROXYZINE HCL 25 MG PO TABS: 25.0000 mg | ORAL_TABLET | Freq: Four times a day (QID) | ORAL | Status: DC | PRN

## 2019-02-15 MED ORDER — TRAZODONE HCL 50 MG PO TABS: 50.0000 mg | ORAL_TABLET | Freq: Every day | ORAL | Status: DC

## 2019-02-15 MED ORDER — CHLORDIAZEPOXIDE HCL 25 MG PO CAPS
25.0000 mg | ORAL_CAPSULE | Freq: Four times a day (QID) | ORAL | Status: DC | PRN
  Administered 2019-02-15: 14:00:00 25 mg via ORAL

## 2019-02-15 MED ORDER — VITAMIN B-1 100 MG PO TABS
100.0000 mg | ORAL_TABLET | Freq: Every day | ORAL | Status: DC
  Administered 2019-02-15: 10:00:00 100 mg via ORAL
  Filled 2019-02-15: qty 1

## 2019-02-15 MED ORDER — CHLORDIAZEPOXIDE HCL 25 MG PO CAPS: 25.0000 mg | ORAL_CAPSULE | Freq: Three times a day (TID) | ORAL | Status: DC

## 2019-02-15 MED ORDER — ADULT MULTIVITAMIN W/MINERALS CH
1.0000 | ORAL_TABLET | Freq: Every day | ORAL | Status: DC
  Administered 2019-02-15: 1 via ORAL
  Filled 2019-02-15: qty 1

## 2019-02-15 MED ORDER — NICOTINE 14 MG/24HR TD PT24
14.0000 mg | MEDICATED_PATCH | Freq: Every day | TRANSDERMAL | Status: DC
  Administered 2019-02-15 – 2019-02-16 (×2): 14 mg via TRANSDERMAL
  Filled 2019-02-15 (×4): qty 1

## 2019-02-15 MED ORDER — ACETAMINOPHEN 325 MG PO TABS
650.0000 mg | ORAL_TABLET | Freq: Four times a day (QID) | ORAL | Status: DC | PRN
  Administered 2019-02-15: 650 mg via ORAL
  Filled 2019-02-15: qty 2

## 2019-02-15 MED ORDER — VITAMIN B-1 100 MG PO TABS
100.0000 mg | ORAL_TABLET | Freq: Every day | ORAL | Status: DC
  Administered 2019-02-15 – 2019-02-17 (×3): 100 mg via ORAL
  Filled 2019-02-15 (×5): qty 1

## 2019-02-15 MED ORDER — FLUOXETINE HCL 20 MG PO CAPS
20.0000 mg | ORAL_CAPSULE | Freq: Every day | ORAL | Status: DC
  Administered 2019-02-15: 14:00:00 20 mg via ORAL
  Filled 2019-02-15: qty 1

## 2019-02-15 MED ORDER — CHLORDIAZEPOXIDE HCL 25 MG PO CAPS: 25.0000 mg | ORAL_CAPSULE | Freq: Every day | ORAL | Status: DC

## 2019-02-15 MED ORDER — MAGNESIUM HYDROXIDE 400 MG/5ML PO SUSP: 30.0000 mL | Freq: Every day | ORAL | Status: DC | PRN

## 2019-02-15 MED ORDER — ONDANSETRON HCL 4 MG PO TABS: 4.0000 mg | ORAL_TABLET | Freq: Three times a day (TID) | ORAL | Status: DC | PRN

## 2019-02-15 MED ORDER — FOLIC ACID 1 MG PO TABS
1.0000 mg | ORAL_TABLET | Freq: Every day | ORAL | Status: DC
  Administered 2019-02-15 – 2019-02-17 (×3): 1 mg via ORAL
  Filled 2019-02-15 (×5): qty 1

## 2019-02-15 MED ORDER — LORAZEPAM 1 MG PO TABS: 0.0000 mg | ORAL_TABLET | Freq: Two times a day (BID) | ORAL | Status: DC

## 2019-02-15 MED ORDER — HYDROXYZINE HCL 25 MG PO TABS
25.0000 mg | ORAL_TABLET | Freq: Three times a day (TID) | ORAL | Status: DC | PRN
  Administered 2019-02-15: 25 mg via ORAL
  Filled 2019-02-15: qty 15
  Filled 2019-02-15 (×2): qty 1

## 2019-02-15 MED ORDER — THIAMINE HCL 100 MG/ML IJ SOLN: 100.0000 mg | Freq: Every day | INTRAMUSCULAR | Status: DC

## 2019-02-15 MED ORDER — NICOTINE 21 MG/24HR TD PT24: 21.0000 mg | MEDICATED_PATCH | Freq: Once | TRANSDERMAL | Status: DC

## 2019-02-15 MED ORDER — LORAZEPAM 1 MG PO TABS: 1.0000 mg | ORAL_TABLET | Freq: Four times a day (QID) | ORAL | Status: DC | PRN

## 2019-02-15 MED ORDER — ACETAMINOPHEN 325 MG PO TABS: 650.0000 mg | ORAL_TABLET | ORAL | Status: DC | PRN

## 2019-02-15 MED ORDER — LORAZEPAM 2 MG/ML IJ SOLN: 0.0000 mg | Freq: Two times a day (BID) | INTRAMUSCULAR | Status: DC

## 2019-02-15 MED ORDER — LORAZEPAM 2 MG/ML IJ SOLN: 0.0000 mg | Freq: Four times a day (QID) | INTRAMUSCULAR | Status: DC

## 2019-02-15 MED ORDER — TRAZODONE HCL 50 MG PO TABS
50.0000 mg | ORAL_TABLET | Freq: Every evening | ORAL | Status: DC | PRN
  Administered 2019-02-15 – 2019-02-16 (×2): 50 mg via ORAL
  Filled 2019-02-15 (×3): qty 1
  Filled 2019-02-15: qty 7

## 2019-02-15 MED ORDER — CHLORDIAZEPOXIDE HCL 25 MG PO CAPS
25.0000 mg | ORAL_CAPSULE | Freq: Four times a day (QID) | ORAL | Status: DC
  Filled 2019-02-15: qty 1

## 2019-02-15 MED ORDER — CHLORDIAZEPOXIDE HCL 25 MG PO CAPS: 25.0000 mg | ORAL_CAPSULE | ORAL | Status: DC

## 2019-02-15 MED ORDER — LORAZEPAM 1 MG PO TABS: 0.0000 mg | ORAL_TABLET | Freq: Four times a day (QID) | ORAL | Status: DC

## 2019-02-15 MED ORDER — ALUM & MAG HYDROXIDE-SIMETH 200-200-20 MG/5ML PO SUSP: 30.0000 mL | ORAL | Status: DC | PRN

## 2019-02-15 NOTE — BH Assessment (Signed)
[facility] Assessment Progress Note  Per Shuvon Rankin, FNP, this pt requires psychiatric hospitalization.  Heather, RN has assigned pt to [facility] Rm 307-2 pending results of Covid-19 test.  This has since come back negative.  Pt presents under IVC initiated by Pt's mother, and upheld by Neita Garnet, MD, and IVC documents have been sent to [facility].  Pt's nurse, Nena Jordan, has been notified, and agrees to call report to [phone].  Pt is to be transported via Event organiser.   Jalene Mullet, [place] Coordinator [phone]

## 2019-02-15 NOTE — ED Notes (Signed)
Call from lab pt blood ETOH 328 pt sleeping unable to wake for CIWA  0122 ativan and nicoderm patch held. Will notify EDP Polina

## 2019-02-15 NOTE — Progress Notes (Signed)
   02/15/19 2200  Psych Admission Type (Psych Patients Only)  Admission Status Involuntary  Psychosocial Assessment  Patient Complaints Anxiety  Eye Contact Fair  Facial Expression Anxious  Affect Anxious  Speech Logical/coherent  Interaction Assertive  Motor Activity Fidgety  Appearance/Hygiene Disheveled  Behavior Characteristics Anxious  Mood Anxious  Thought Process  Coherency Circumstantial  Content WDL  Delusions None reported or observed  Perception WDL  Hallucination None reported or observed  Judgment Impaired  Confusion None  Danger to Self  Current suicidal ideation? Denies  Danger to Others  Danger to Others None reported or observed   D: Pt visible in the dayroom some this evening. Pt appeared to have minimal insight into his situation, but pt appeared not very focused on things that really need changing in his situation. Pt pleasant  A: Pt was offered support and encouragement. Pt was given scheduled medications. Pt was encourage to attend groups. Q 15 minute checks were done for safety.  R: safety maintained on unit.

## 2019-02-15 NOTE — BHH Counselor (Signed)
Clinician spoke to [place], [place] and noted pt is sleeping, and unable to engage in TTS assessment. Per Mortimer Fries, RN pt's BAL was 328 at 0124. Pt to be assessed once he is able to engage in TTS assessment.   Vertell Novak, [place], [facility], [facility] Triage Specialist [phone]

## 2019-02-15 NOTE — Progress Notes (Signed)
ADMISSION NOTE:  Pt is a 33 y.o. male who was involuntarily committed to [facility].  Pt reported that his mother was worried about pt's safety and that pt has been drinking too much alcohol.  Pt is alert and oriented x 4.  Pt present with blunted affect and pleasant mood. Pt denies SI, HI and/or AVH.  Pt stated that he has too much to live for such as his 51 year old son Ulice Dash and his girlfriend who lives in [place].  Pt stated that his major stressor is not being able to find a job and be able to support his girlfriend and son.  Pt also repoprted that it is increasingly difficult "traveling up and down the [employer]" to visit his girlfriend.    Pt denied using illicit drugs.  He stated that he smokes 1 pack of cigarettes per day.  Pt stated that his goals are to "find a job" and "get his life together."   Pt disclosed that he has had two previous hospitalizations this year at [facility] for the same issues described above.  Pt does not have medical insurance which makes it difficult for pt to obtain medications that are prescribed to him.  RN and pt discussed pt's admission plan of care and consents signed.  Pt verbalized understanding of plan of care.  RN oriented pt to the staff, unit, and room.  Skin assessment was completed.  Scar noted on right knee and bruise noted on left shin.  Pt's belongings secured in locker. Routine safety checks initiated.  Pt is safe on the unit.

## 2019-02-15 NOTE — Consult Note (Addendum)
[name], 33 y.o., male patient seen via telepsych by this provider; chart reviewed and consulted with Dr. Rayna Sexton on 02/15/19.  On evaluation [name] asked what brought him to the hospital patient responded " is really embarrassing I get drunk last night said some things I did not mean just of drunk people say but did not really mean it."  Patient reported he lives with his mother and his son.  States that he is unemployed right now but he does have a job lined up that way to be taking him to" [place]."  Reports that he was drinking last night because " makes his get me going through a lot of stuff.  But I feel better now I just want to go home and care from her son."  Patient informed that he was involuntarily committed with complaints that he had taken a knife and held it to his wrist stating that he wanted to kill himself.  Patient states that it is not true that he lives his life and he does not want to die.  Patient denies prior history of suicide attempts, violence, and that he has only been in a psychiatric hospital once at the age of 63 related to depression.  According to chart review patient has had inpatient psychiatric hospitalization at [facility] twice.  Once 08/24/2018 through 08/27/2018 and 7/9 through 10/24/2018 There is also a history of patient try to hang himself and son was the one who found him trying to take the belt from around his neck.  Patient minimizing substance use and suicidal ideation.  Will recommend inpatient psychiatric treatment  For detailed note see TTS assessment note   Results for orders placed or performed during the hospital encounter of 02/14/19 (from the past 24 hour(s))  CBC     Status: Abnormal   Collection Time: 02/14/19 11:50 PM  Result Value Ref Range   WBC 5.1 4.0 - 10.5 K/uL   RBC 4.62 4.22 - 5.81 MIL/uL   Hemoglobin 16.5 13.0 - 17.0 g/dL   HCT 44.9 67.5 - 91.6 %   MCV 104.3 (H) 80.0 - 100.0 fL   MCH 35.7 (H) 26.0 - 34.0 pg    MCHC 34.2 30.0 - 36.0 g/dL   RDW 38.4 66.5 - 99.3 %   Platelets 181 150 - 400 K/uL   nRBC 0.0 0.0 - 0.2 %  Comprehensive metabolic panel     Status: Abnormal   Collection Time: 02/14/19 11:50 PM  Result Value Ref Range   Sodium 144 135 - 145 mmol/L   Potassium 3.7 3.5 - 5.1 mmol/L   Chloride 109 98 - 111 mmol/L   CO2 22 22 - 32 mmol/L   Glucose, Bld 95 70 - 99 mg/dL   BUN 6 6 - 20 mg/dL   Creatinine, Ser 5.70 0.61 - 1.24 mg/dL   Calcium 8.8 (L) 8.9 - 10.3 mg/dL   Total Protein 7.4 6.5 - 8.1 g/dL   Albumin 4.2 3.5 - 5.0 g/dL   AST 39 15 - 41 U/L   ALT 30 0 - 44 U/L   Alkaline Phosphatase 61 38 - 126 U/L   Total Bilirubin 0.7 0.3 - 1.2 mg/dL   GFR calc non Af Amer >60 >60 mL/min   GFR calc Af Amer >60 >60 mL/min   Anion gap 13 5 - 15  Ethanol     Status: Abnormal   Collection Time: 02/14/19 11:50 PM  Result Value Ref Range   Alcohol, Ethyl (  B) 328 (HH) <10 mg/dL  Rapid urine drug screen (hospital performed)     Status: Abnormal   Collection Time: 02/15/19  6:30 AM  Result Value Ref Range   Opiates NONE DETECTED NONE DETECTED   Cocaine NONE DETECTED NONE DETECTED   Benzodiazepines POSITIVE (A) NONE DETECTED   Amphetamines NONE DETECTED NONE DETECTED   Tetrahydrocannabinol POSITIVE (A) NONE DETECTED   Barbiturates NONE DETECTED NONE DETECTED     Recommendation: Inpatient psychiatric treatment.  TTS/social work to attempt to get collateral information from patient's mother  Medication:  Discontinued Ativan detox protocol and started the Librium detox protocol (labs reviewed see above; substance use history [UDS + Benzo and THC)) Started Trazodone 50 mg Q hs prn for sleep Started Prozac 20 mg Qd for anxiety, depression, and alcohol use disorder  Disposition:  Recommend psychiatric Inpatient admission when medically cleared.    Shuvon Rankin, NP  Attest to NP note
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
## 2019-02-15 NOTE — Tx Team (Signed)
Initial Treatment Plan 02/15/2019 5:41 PM Juanjose Mojica [facility]:[id number]    PATIENT STRESSORS: Financial difficulties Health problems Occupational concerns Substance abuse   PATIENT STRENGTHS: Agricultural engineer for treatment/growth Supportive family/friends   PATIENT IDENTIFIED PROBLEMS: "Not able to find a job."  Suicidal Ideation  Substance abuse                 DISCHARGE CRITERIA:  Ability to meet basic life and health needs Motivation to continue treatment in a less acute level of care  PRELIMINARY DISCHARGE PLAN: Attend aftercare/continuing care group Outpatient therapy Return to previous living arrangement  PATIENT/FAMILY INVOLVEMENT: This treatment plan has been presented to and reviewed with the patient, Mouhamed Glassco.  The patient and family have been given the opportunity to ask questions and make suggestions.  Luna Glasgow, RN 02/15/2019, 5:41 PM

## 2019-02-16 DIAGNOSIS — F1020 Alcohol dependence, uncomplicated: Secondary | ICD-10-CM

## 2019-02-16 MED ORDER — BUSPIRONE HCL 10 MG PO TABS
10.0000 mg | ORAL_TABLET | Freq: Three times a day (TID) | ORAL | Status: DC
  Administered 2019-02-16 – 2019-02-17 (×4): 10 mg via ORAL
  Filled 2019-02-16 (×7): qty 1

## 2019-02-16 MED ORDER — ACAMPROSATE CALCIUM 333 MG PO TBEC
666.0000 mg | DELAYED_RELEASE_TABLET | Freq: Three times a day (TID) | ORAL | Status: DC
  Administered 2019-02-16 – 2019-02-17 (×4): 666 mg via ORAL
  Filled 2019-02-16 (×7): qty 2
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
## 2019-02-16 NOTE — Plan of Care (Signed)
Patient is newly admitted and adjusting well to the unit. Currently sitting in the dayroom journeying. Pleasant upon approach and engaged in conversation. Alert and oriented. Denying suicidal thoughts. Reports that he was involuntarily committed by his mother secondary to increased drinking "I learned my lesson and I am ready to..move on". Patient reports that he lives with his mother and his 35 year old son and has been drinking heavily for the past couple of weeks. Reports that he is feeling fine and hoping to be discharged tomorrow. No withdrawal symptoms noted. Staff continue to monitor.

## 2019-02-16 NOTE — [facility] Suicide Risk Assessment (Signed)
[facility] Admission Suicide Risk Assessment   Nursing information obtained from:  Patient Demographic factors:  Male Current Mental Status:  Self-harm thoughts Loss Factors:  Financial problems / change in socioeconomic status, Decrease in vocational status Historical Factors:  NA Risk Reduction Factors:  Responsible for children under 35 years of age  Total Time spent with patient: 45 minutes Principal Problem: <principal problem not specified> Diagnosis:  Active Problems:   Alcohol use disorder, severe, dependence (HCC)  Subjective Data: Patient is seen and examined.  Patient is a 33 year old male with a past psychiatric history significant for alcohol dependence who was placed under involuntary commitment by his mother and taken to the [facility] emergency department on 02/15/2019.  According to the involuntary commitment paperwork the patient had verbalized wanting to kill himself, and placed a knife at his wrist.  [facility] police were called to the home, and he was brought to the hospital for evaluation.  The patient stated that he has been in [place] with his significant other, and that came back to the area day before yesterday to see his son in visit.  He stated that since his last psychiatric hospitalization he had remained sober.  But got upset yesterday and drank alcohol.  He stated that he had not had any alcohol prior to that.  He has at least 2 previous psychiatric admissions at our facility as an adult.  His most recent hospitalization was on 10/20/2020 10/24/2018.  Once again the story was that he had come back from [place] and he started drinking again.  He was discharged on acamprosate, trazodone and hydroxyzine.  He stated that he was not taking medications, and been unable to afford his medications because of his current unemployment.  He currently denies suicidal ideation, and wants to be released.  He has stated that he will sign a release of information for  [place] to be able to talk to his mother.  His blood alcohol on admission was 320, his drug screen was positive for benzodiazepines and marijuana.  His MCV was elevated at 104.3.  Liver function enzymes were normal.  Currently his blood pressure is 125/89, pulse is 76 and he is afebrile.  He slept 6 hours last night.  There are no recordings of the chart of any ratings on his CIWA at this point.  Continued Clinical Symptoms:  Alcohol Use Disorder Identification Test Final Score (AUDIT): 2 The "Alcohol Use Disorders Identification Test", Guidelines for Use in Primary Care, Second Edition.  World Science writer [facility]). Score between 0-7:  no or low risk or alcohol related problems. Score between 8-15:  moderate risk of alcohol related problems. Score between 16-19:  high risk of alcohol related problems. Score 20 or above:  warrants further diagnostic evaluation for alcohol dependence and treatment.   CLINICAL FACTORS:   Depression:   Comorbid alcohol abuse/dependence Impulsivity Insomnia Alcohol/Substance Abuse/Dependencies More than one psychiatric diagnosis Previous Psychiatric Diagnoses and Treatments   Musculoskeletal: Strength & Muscle Tone: within normal limits Gait & Station: normal Patient leans: N/A  Psychiatric Specialty Exam: Physical Exam  Nursing note and vitals reviewed. Constitutional: He is oriented to person, place, and time. He appears well-developed and well-nourished.  HENT:  Head: Normocephalic and atraumatic.  Respiratory: Effort normal.  Neurological: He is alert and oriented to person, place, and time.    ROS  Blood pressure 125/89, pulse 76, temperature 97.8 F (36.6 C), temperature source Oral, resp. rate 18, height 6' (1.829 m), weight 77.1 kg, SpO2  98 %.Body mass index is 23.06 kg/m.  General Appearance: Disheveled  Eye Contact:  Fair  Speech:  Normal Rate  Volume:  Normal  Mood:  Anxious  Affect:  Congruent  Thought Process:  Coherent and  Descriptions of Associations: Intact  Orientation:  Full (Time, Place, and Person)  Thought Content:  Logical  Suicidal Thoughts:  No  Homicidal Thoughts:  No  Memory:  Immediate;   Fair Recent;   Fair Remote;   Fair  Judgement:  Impaired  Insight:  Lacking  Psychomotor Activity:  Increased  Concentration:  Concentration: Good and Attention Span: Good  Recall:  [employer] of Knowledge:  Fair  Language:  Good  Akathisia:  Negative  Handed:  Right  AIMS (if indicated):     Assets:  Desire for Improvement Resilience  ADL's:  Intact  Cognition:  WNL  Sleep:         COGNITIVE FEATURES THAT CONTRIBUTE TO RISK:  None    SUICIDE RISK:   Minimal: No identifiable suicidal ideation.  Patients presenting with no risk factors but with morbid ruminations; may be classified as minimal risk based on the severity of the depressive symptoms  PLAN OF CARE: Patient is seen and examined.  Patient is a 33 year old male with a past psychiatric history significant for alcohol dependence with recent relapse according to the patient.  He denied suicidal ideation.  He will be admitted to the hospital.  He will be integrated into the milieu.  He will be encouraged to attend groups.  He will receive lorazepam 1 mg p.o. 4 times daily as needed a CIWA greater than 10.  He will be placed on withdrawal precautions.  We will start the Campral 666 mg p.o. 3 times daily for alcohol cravings.  We will contact his mother after he signed a release of information for collateral information.  If his collateral information is true according to the patient we will monitor him for approximately 12-24 more hours and make sure that he is not having any withdrawal symptoms.  He will also be placed on BuSpar 10 mg p.o. twice daily for anxiety.  He has refused psychiatric medicines in the past for anxiety or depression.  He will also have available trazodone for sleep as well as hydroxyzine for anxiety.  I certify that inpatient  services furnished can reasonably be expected to improve the patient's condition.   Sharma Covert, MD 02/16/2019, 9:00 AM

## 2019-02-16 NOTE — [facility] Suicide Risk Assessment (Signed)
[place] INPATIENT:  Family/Significant Other Suicide Prevention Education  Suicide Prevention Education:  Education Completed;  mother, [name] [phone]) has been identified by the patient as the family member/significant other with whom the patient will be residing, and identified as the person(s) who will aid the patient in the event of a mental health crisis (suicidal ideations/suicide attempt).  With written consent from the patient, the family member/significant other has been provided the following suicide prevention education, prior to the and/or following the discharge of the patient.  The suicide prevention education provided includes the following:  Suicide risk factors  Suicide prevention and interventions  National Suicide Hotline telephone number  [facility] assessment telephone number  [facility] Emergency Assistance [place] and/or Residential Mobile Crisis Unit telephone number  Request made of family/significant other to:  Remove weapons (e.g., guns, rifles, knives), all items previously/currently identified as safety concern.    Remove drugs/medications (over-the-counter, prescriptions, illicit drugs), all items previously/currently identified as a safety concern.  The family member/significant other verbalizes understanding of the suicide prevention education information provided.  The family member/significant other agrees to remove the items of safety concern listed above.     Mother states that she did not intend for the patient to come to [facility], she reports that she petitioned an IVC for the patient in an attempt for him to be brought to an emergency department for a medical detox. She asked if he could be discharged today or tomorrow. She has no safety concerns for discharge, she states she does not believe he was suicidal.   Mother confirms the patient continues to drink alcohol. Mother confirms patient is not compliant with  medications and he does not follow up with appointments.   No additional questions or concerns, she will pick up the patient at discharge.   Joellen Jersey 02/16/2019, 2:37 PM

## 2019-02-16 NOTE — Progress Notes (Signed)
Recreation Therapy Notes  Date:  11.4.20 Time: 0930 Location: [address] Group Room  Group Topic: Stress Management  Goal Area(s) Addresses:  Patient will identify positive stress management techniques. Patient will identify benefits of using stress management post d/c.  Intervention: Stress Management  Activity :  Guided Imagery.  LRT introduced the stress management technique of guided imagery.  LRT read a script that took patients on a mental vacation to the beach.  Patients were to listen as LRT read the script to engage in activity.  Education:  Stress Management, Discharge Planning.   Education Outcome: Acknowledges Education  Clinical Observations/Feedback:  Pt did not attend group.    Victorino Sparrow, LRT/CTRS         Victorino Sparrow A 02/16/2019 12:21 PM
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
## 2019-02-16 NOTE — Tx Team (Signed)
Interdisciplinary Treatment and Diagnostic Plan Update  02/16/2019 Time of Session: 9:00am [name] MRN: [id number]  Principal Diagnosis: <principal problem not specified>  Secondary Diagnoses: Active Problems:   Alcohol use disorder, severe, dependence (HCC)   Current Medications:  Current Facility-Administered Medications  Medication Dose Route Frequency Provider Last Rate Last Dose  . acamprosate (CAMPRAL) tablet 666 mg  666 mg Oral TID WC Sharma Covert, MD   666 mg at 02/16/19 0907  . acetaminophen (TYLENOL) tablet 650 mg  650 mg Oral Q6H PRN Sharma Covert, MD   650 mg at 02/15/19 2140  . alum & mag hydroxide-simeth (MAALOX/MYLANTA) 200-200-20 MG/5ML suspension 30 mL  30 mL Oral Q4H PRN Sharma Covert, MD      . busPIRone (BUSPAR) tablet 10 mg  10 mg Oral TID Sharma Covert, MD   10 mg at 02/16/19 0907  . folic acid (FOLVITE) tablet 1 mg  1 mg Oral Daily Sharma Covert, MD   1 mg at 02/16/19 8676  . hydrOXYzine (ATARAX/VISTARIL) tablet 25 mg  25 mg Oral TID PRN Sharma Covert, MD   25 mg at 02/15/19 1751  . LORazepam (ATIVAN) tablet 1 mg  1 mg Oral Q6H PRN Sharma Covert, MD      . magnesium hydroxide (MILK OF MAGNESIA) suspension 30 mL  30 mL Oral Daily PRN Sharma Covert, MD      . nicotine (NICODERM CQ - dosed in mg/24 hours) patch 14 mg  14 mg Transdermal Daily Sharma Covert, MD   14 mg at 02/16/19 0907  . thiamine (VITAMIN B-1) tablet 100 mg  100 mg Oral Daily Sharma Covert, MD   100 mg at 02/16/19 7209  . traZODone (DESYREL) tablet 50 mg  50 mg Oral QHS PRN Sharma Covert, MD   50 mg at 02/15/19 2128   PTA Medications: Medications Prior to Admission  Medication Sig Dispense Refill Last Dose  . nicotine (NICODERM CQ - DOSED IN MG/24 HOURS) 21 mg/24hr patch Place 1 patch (21 mg total) onto the skin daily. (Patient not taking: Reported on 10/21/2018) 28 patch 0   . traZODone (DESYREL) 50 MG tablet Take 1 tablet (50 mg total)  by mouth at bedtime as needed for sleep. (Patient not taking: Reported on 02/15/2019) 15 tablet 0     Patient Stressors: Financial difficulties Health problems Occupational concerns Substance abuse  Patient Strengths: Agricultural engineer for treatment/growth Supportive family/friends  Treatment Modalities: Medication Management, Group therapy, Case management,  1 to 1 session with clinician, Psychoeducation, Recreational therapy.   Physician Treatment Plan for Primary Diagnosis: <principal problem not specified> Long Term Goal(s):     Short Term Goals:    Medication Management: Evaluate patient's response, side effects, and tolerance of medication regimen.  Therapeutic Interventions: 1 to 1 sessions, Unit Group sessions and Medication administration.  Evaluation of Outcomes: Progressing  Physician Treatment Plan for Secondary Diagnosis: Active Problems:   Alcohol use disorder, severe, dependence ([place])  Long Term Goal(s):     Short Term Goals:       Medication Management: Evaluate patient's response, side effects, and tolerance of medication regimen.  Therapeutic Interventions: 1 to 1 sessions, Unit Group sessions and Medication administration.  Evaluation of Outcomes: Progressing   RN Treatment Plan for Primary Diagnosis: <principal problem not specified> Long Term Goal(s): Knowledge of disease and therapeutic regimen to maintain health will improve  Short Term Goals: Ability to demonstrate self-control, Ability to participate in  decision making will improve, Ability to verbalize feelings will improve, Ability to disclose and discuss suicidal ideas and Ability to identify and develop effective coping behaviors will improve  Medication Management: RN will administer medications as ordered by provider, will assess and evaluate patient's response and provide education to patient for prescribed medication. RN will report any adverse and/or side effects to  prescribing provider.  Therapeutic Interventions: 1 on 1 counseling sessions, Psychoeducation, Medication administration, Evaluate responses to treatment, Monitor vital signs and CBGs as ordered, Perform/monitor CIWA, COWS, AIMS and Fall Risk screenings as ordered, Perform wound care treatments as ordered.  Evaluation of Outcomes: Progressing   LCSW Treatment Plan for Primary Diagnosis: <principal problem not specified> Long Term Goal(s): Safe transition to appropriate next level of care at discharge, Engage patient in therapeutic group addressing interpersonal concerns.  Short Term Goals: Engage patient in aftercare planning with referrals and resources, Increase social support, Increase emotional regulation, Identify triggers associated with mental health/substance abuse issues and Increase skills for wellness and recovery  Therapeutic Interventions: Assess for all discharge needs, 1 to 1 time with Social worker, Explore available resources and support systems, Assess for adequacy in community support network, Educate family and significant other(s) on suicide prevention, Complete Psychosocial Assessment, Interpersonal group therapy.  Evaluation of Outcomes: Progressing   Progress in Treatment: Attending groups: No. Participating in groups: No. Taking medication as prescribed: Yes. Toleration medication: Yes. Family/Significant other contact made: No, will contact:  mother, [place] Abide Patient understands diagnosis: No. Discussing patient identified problems/goals with staff: No. Medical problems stabilized or resolved: Yes. Denies suicidal/homicidal ideation: Yes. Issues/concerns per patient self-inventory: Yes.  New problem(s) identified: Yes, Describe:  financial stressors  New Short Term/Long Term Goal(s): detox, medication management for mood stabilization; elimination of SI thoughts; development of comprehensive mental wellness/sobriety plan.  Patient Goals: "Go home."  Discharge  Plan or Barriers: Patient intends to return home. CSW will offer patient outpatient resources.  Reason for Continuation of Hospitalization: Anxiety Depression Withdrawal symptoms  Estimated Length of Stay: 1-3 days  Attendees: Patient: [name] 02/16/2019 10:31 AM  Physician: Marguerita Merles 02/16/2019 10:31 AM  Nursing: Marton Redwood, RN 02/16/2019 10:31 AM  RN Care Manager: 02/16/2019 10:31 AM  Social Worker: Enid Cutter, LCSWA 02/16/2019 10:31 AM  Recreational Therapist:  02/16/2019 10:31 AM  Other: Marciano Sequin, NP 02/16/2019 10:31 AM  Other:  02/16/2019 10:31 AM  Other: 02/16/2019 10:31 AM    Scribe for Treatment Team: Darreld Mclean, LCSWA 02/16/2019 10:31 AM

## 2019-02-16 NOTE — [facility] Group Notes (Signed)
02/16/2019 8:45am Type of Group and Topic: Psychoeducational Group: Discharge Planning  Participation Level: Minimal  Description of Group Discharge planning group reviews patient's anticipated discharge plans and assists patients to anticipate and address any barriers to wellness/recovery in the community. Suicide prevention education is reviewed with patients in group. Therapeutic Goals 1. Patients will state their anticipated discharge plan and mental health aftercare 2. Patients will identify potential barriers to wellness in the community setting 3. Patients will engage in problem solving, solution focused discussion of ways to anticipate and address barriers to wellness/recovery   Summary of Patient Progress Plan for Discharge/Comments:  Emigdio reports he does not have any concerns regarding his discharge planning. He reports he would like to discharge as soon as possible.   Transportation Means: Mother, girlfriend.    Supports: Mother, girlfriend    Therapeutic Modalities: Motivational Interviewing     Radonna Ricker, MSW, [place] Worker [facility]  Phone: [phone] 02/16/2019 3:20 PM

## 2019-02-16 NOTE — Progress Notes (Signed)
[facility] Group Notes:  (Nursing/MHT/Case Management/Adjunct)  Date:  02/16/2019  Time: 2030  Type of Therapy:  wrap up group  Participation Level:  Active  Participation Quality:  Appropriate, Attentive, Sharing and Supportive  Affect:  Blunted  Cognitive:  Appropriate  Insight:  Improving  Engagement in Group:  Engaged  Modes of Intervention:  Clarification, Education and Support  Summary of Progress/Problems: Pt shares that it was a mistake that he is here. Making plans on moving to [place] with his girlfriend and her parents. Pt shared he is grateful for his 7 year old son.   Shellia Cleverly 02/16/2019, 9:38 PM

## 2019-02-17 MED ORDER — NICOTINE 14 MG/24HR TD PT24: 14.0000 mg | MEDICATED_PATCH | Freq: Every day | TRANSDERMAL | 0 refills | Status: AC

## 2019-02-17 MED ORDER — ACAMPROSATE CALCIUM 333 MG PO TBEC: 666.0000 mg | DELAYED_RELEASE_TABLET | Freq: Three times a day (TID) | ORAL | 0 refills | Status: AC

## 2019-02-17 MED ORDER — HYDROXYZINE HCL 25 MG PO TABS: 25.0000 mg | ORAL_TABLET | Freq: Three times a day (TID) | ORAL | 0 refills | Status: AC | PRN

## 2019-02-17 MED ORDER — BUSPIRONE HCL 10 MG PO TABS: 10.0000 mg | ORAL_TABLET | Freq: Three times a day (TID) | ORAL | 0 refills | Status: AC

## 2019-02-17 MED ORDER — TRAZODONE HCL 50 MG PO TABS: 50.0000 mg | ORAL_TABLET | Freq: Every evening | ORAL | 0 refills | Status: AC | PRN

## 2019-02-17 NOTE — Progress Notes (Signed)
  [facility] Adult Case Management Discharge Plan :  Will you be returning to the same living situation after discharge:  Yes,  home. At discharge, do you have transportation home?: Yes,  mother will pick up at 10am. Do you have the ability to pay for your medications: No. Referred to [facility] of the [place].  Release of information consent forms completed and in the chart.  Patient to Follow up at: Follow-up [place] to.   Specialty: Professional Counselor Why: Walk in hours for new patient appointments are Monday through Friday 8am to 1pm. Please bring photo ID, proof of income or insurance, and current medications. Please wear a mask. Contact information: [facility] [place] [address] [phone]           Next level of care provider has access to [place] and Suicide Prevention discussed: Yes,  with mother.   Has patient been referred to the Quitline?: Patient refused referral  Patient has been referred for addiction treatment: Pt. refused referral  Joellen [place], [place] 02/17/2019, 9:11 AM

## 2019-02-17 NOTE — Plan of Care (Signed)
Discharge note  Patient verbalizes readiness for discharge. Follow up plan explained, AVS, Transition record and SRA given. Prescriptions and teaching provided. Belongings returned and signed for. Suicide safety plan completed and signed. Patient verbalizes understanding. Patient denies SI/HI and assures this Probation officer he will seek assistance should that change. Patient discharged to lobby where mother was waiting.  Problem: Education: Goal: Knowledge of [place] [facility] Education information/materials will improve Outcome: Adequate for Discharge Goal: Emotional status will improve Outcome: Adequate for Discharge Goal: Mental status will improve Outcome: Adequate for Discharge Goal: Verbalization of understanding the information provided will improve Outcome: Adequate for Discharge   Problem: Coping: Goal: Ability to identify and develop effective coping behavior will improve Outcome: Adequate for Discharge Goal: Ability to interact with others will improve Outcome: Adequate for Discharge Goal: Demonstration of participation in decision-making regarding own care will improve Outcome: Adequate for Discharge Goal: Ability to use eye contact when communicating with others will improve Outcome: Adequate for Discharge   Problem: Self-Concept: Goal: Will verbalize positive feelings about self Outcome: Adequate for Discharge   Problem: Education: Goal: Knowledge of disease or condition will improve Outcome: Adequate for Discharge Goal: Understanding of discharge needs will improve Outcome: Adequate for Discharge   Problem: Medication: Goal: Compliance with prescribed medication regimen will improve Outcome: Adequate for Discharge   Problem: Self-Concept: Goal: Ability to disclose and discuss suicidal ideas will improve Outcome: Adequate for Discharge Goal: Will verbalize positive feelings about self Outcome: Adequate for Discharge

## 2019-02-17 NOTE — [facility] Suicide Risk Assessment (Signed)
[facility] Discharge Suicide Risk Assessment   Principal Problem: Alcohol use disorder, severe, dependence ([place]) Discharge Diagnoses: Principal Problem:   Alcohol use disorder, severe, dependence ([place]) Active Problems:   Alcohol abuse with alcohol-induced mood disorder ([place])   Total Time spent with patient: 20 minutes  Musculoskeletal: Strength & Muscle Tone: within normal limits Gait & Station: normal Patient leans: N/A  Psychiatric Specialty Exam: Review of Systems  All other systems reviewed and are negative.   Blood pressure 118/87, pulse 73, temperature 97.9 F (36.6 C), temperature source Oral, resp. rate 16, height 6' (1.829 m), weight 77.1 kg, SpO2 100 %.Body mass index is 23.06 kg/m.  General Appearance: Casual  Eye Contact::  Fair  Speech:  Normal Rate409  Volume:  Normal  Mood:  Anxious  Affect:  Congruent  Thought Process:  Coherent and Descriptions of Associations: Intact  Orientation:  Full (Time, Place, and Person)  Thought Content:  Logical  Suicidal Thoughts:  No  Homicidal Thoughts:  No  Memory:  Immediate;   Fair Recent;   Fair Remote;   Fair  Judgement:  Intact  Insight:  Fair  Psychomotor Activity:  Increased  Concentration:  Fair  Recall:  Good  Fund of Knowledge:Good  Language: Good  Akathisia:  Negative  Handed:  Right  AIMS (if indicated):     Assets:  Desire for Improvement Housing Resilience  Sleep:  Number of Hours: 3  Cognition: WNL  ADL's:  Intact   Mental Status Per Nursing Assessment::   On Admission:  Self-harm thoughts  Demographic Factors:  Male, Divorced or widowed, Caucasian and Low socioeconomic status  Loss Factors: NA  Historical Factors: Impulsivity  Risk Reduction Factors:   Responsible for children under 67 years of age, Sense of responsibility to family and Living with another person, especially a relative  Continued Clinical Symptoms:  Depression:   Comorbid alcohol  abuse/dependence Impulsivity Alcohol/Substance Abuse/Dependencies  Cognitive Features That Contribute To Risk:  None    Suicide Risk:  Minimal: No identifiable suicidal ideation.  Patients presenting with no risk factors but with morbid ruminations; may be classified as minimal risk based on the severity of the depressive symptoms    Plan Of Care/Follow-up recommendations:  Activity:  ad lib  Sharma Covert, MD 02/17/2019, 9:06 AM

## 2019-02-17 NOTE — Discharge Summary (Signed)
Physician Discharge Summary Note  Patient:  [name] is an 33 y.o., male  MRN:  [id number]  DOB:  08-24-1985  Patient phone:  [phone] (home)   Patient address:   [address] [place] [address],   Total Time spent with patient: Greater than 30 minutes  Date of Admission:  02/15/2019  Date of Discharge: 02/17/2019  Reason for Admission: Patient verbally threatened to kill himself while holding a knife to his chest.  Principal Problem: Alcohol use disorder, severe, dependence (HCC)  Discharge Diagnoses: Principal Problem:   Alcohol use disorder, severe, dependence (HCC) Active Problems:   Alcohol abuse with alcohol-induced mood disorder (HCC)  Past Psychiatric History: Alcohol use disorder.  Past Medical History:  Past Medical History:  Diagnosis Date  . Anxiety   . OCD (obsessive compulsive disorder)    History reviewed. No pertinent surgical history.  Family History: History reviewed. No pertinent family history.  Family Psychiatric  History: See H&P  Social History:  Social History   Substance and Sexual Activity  Alcohol Use Yes     Social History   Substance and Sexual Activity  Drug Use Yes  . Types: Marijuana    Social History   Socioeconomic History  . Marital status: Single    Spouse name: Not on file  . Number of children: Not on file  . Years of education: Not on file  . Highest education level: Not on file  Occupational History  . Not on file  Social Needs  . Financial resource strain: Not on file  . Food insecurity    Worry: Not on file    Inability: Not on file  . Transportation needs    Medical: Not on file    Non-medical: Not on file  Tobacco Use  . Smoking status: Current Every Day Smoker    Packs/day: 1.00    Types: Cigarettes  . Smokeless tobacco: Never Used  Substance and Sexual Activity  . Alcohol use: Yes  . Drug use: Yes    Types: Marijuana  . Sexual activity: Not on file  Lifestyle  .  Physical activity    Days per week: Not on file    Minutes per session: Not on file  . Stress: Not on file  Relationships  . Social Musician on phone: Not on file    Gets together: Not on file    Attends religious service: Not on file    Active member of club or organization: Not on file    Attends meetings of clubs or organizations: Not on file    Relationship status: Not on file  Other Topics Concern  . Not on file  Social History Narrative  . Not on file   Hospital Course: (Per Md's admission evaluation): Patient is a 33 year old male with a past psychiatric history significant for alcohol dependence who was placed under involuntary commitment by his mother and taken to the [facility] emergency department on 02/15/2019. According to the involuntary commitment paperwork the patient had verbalized wanting to kill himself, and placed a knife at his wrist. [facility] police were called to the home, and he was brought to the hospital for evaluation. The patient stated that he has been in [place] with his significant other, and that came back to the area day before yesterday to see his son in visit. He stated that since his last psychiatric hospitalization he had remained sober. But got upset yesterday and drank alcohol. He stated  that he had not had any alcohol prior to that. He has at least 2 previous psychiatric admissions at our facility as an adult. His most recent hospitalization was on 10/21/2018- 10/24/2018. Once again the story was that he had come back from [place] and he started drinking again.    [name] for verbally threatening to kill himself while holding a knife to his chest. The cops were called & [facility]. After evaluation of his symptoms, [name] was recommended for mood stabilization treatments.  Then, the medication management were discussed & initiated targeting those  presenting symptoms. He was oriented to the unit and encouraged to participate in the unit programming. He presented no other pre-existing medical problems that required treatments. He tolerated his treatment regimen without any adverse effects or reactions reported.     During the course of his brief hospitalization, [name] was evaluated by a clinical provider to ascertain his response to his treatment regimen. As the day goes by, improvement was noted by the patient's report of decreasing symptoms, improved mood, presentation of good affect & participation in the unit programming. He was required on daily basis to complete a self-inventory asssessment noting mood, mental status, any new symptoms, anxiety and concerns. His symptoms responded well to his treatment regimen, being in a therapeutic & supportive environment also assisted in his mood stability. [name] did present appropriate behavior while on the unit. He worked closely with the treatment team and case manager to develop a discharge plan with appropriate goals to maintain mood stability after discharge.   On this day of his hospital discharge, [name] was in much improved condition than upon admission. His symptoms were reported as significantly decreased or resolved completely. Upon discharge, he denies SI/HI and voiced no AVH. He was motivated to continue taking medication with a goal of continued improvement in mental health. He will continue mental health care on an outpatient basis as noted below. [facility], a 7 days worth supply samples [facility]. He was able to engage in safety planning including [facility] or contact emergency services if he feels unable to maintain his own safety or the safety of others. Pt had no further questions, comments, or concerns. [facility] personal belongings in no apparent distress. Transportation per family (mother).        Physical  Findings: AIMS:  , ,  ,  ,    CIWA:    COWS:     Musculoskeletal: Strength & Muscle Tone: within normal limits Gait & Station: normal Patient leans: N/A  Psychiatric Specialty Exam: See SRA by MD  Physical Exam  Nursing note and vitals reviewed. Constitutional: He is oriented to person, place, and time. He appears well-developed.  Neck: Normal range of motion.  Cardiovascular: Normal rate.  Respiratory: Effort normal.  Genitourinary:    Genitourinary Comments: Deferred   Musculoskeletal: Normal range of motion.  Neurological: He is alert and oriented to person, place, and time.  Skin: Skin is warm and dry.    Review of Systems  Constitutional: Negative for chills and fever.  Respiratory: Negative for cough, shortness of breath and wheezing.   Cardiovascular: Negative for chest pain and palpitations.  Gastrointestinal: Negative for abdominal pain, heartburn, nausea and vomiting.  Neurological: Negative for dizziness and headaches.  Psychiatric/Behavioral: Positive for depression (Stabilized with medication prior to discharge) and substance abuse (Hx.  alcohol, benzodiazepine & THC use disorders). Negative for hallucinations, memory loss and suicidal ideas. The patient has insomnia (Stabilized with medication prior to discharge). The patient is not nervous/anxious (Stable).   All other systems reviewed and are negative.   Blood pressure 118/87, pulse 73, temperature 97.9 F (36.6 C), temperature source Oral, resp. rate 16, height 6' (1.829 m), weight 77.1 kg, SpO2 100 %.Body mass index is 23.06 kg/m.  See Md's discharge SRA.  Has this patient used any form of tobacco in the last 30 days? (Cigarettes, Smokeless Tobacco, Cigars, and/or Pipes):Yes, an FDA-approved tobacco cessation medication was recommended at discharge.  Blood Alcohol level:  Lab Results  Component Value Date   ETH 328 [facility]) 02/14/2019   ETH 329 (HH) 65/68/1275   Metabolic Disorder Labs:  Lab Results   Component Value Date   HGBA1C 4.9 08/25/2018   MPG 93.93 08/25/2018   No results found for: PROLACTIN Lab Results  Component Value Date   CHOL 207 (H) 08/25/2018   TRIG 20 08/25/2018   HDL >135 08/25/2018   CHOLHDL NOT CALCULATED 08/25/2018   VLDL 4 08/25/2018   LDLCALC NOT CALCULATED 08/25/2018   See Psychiatric Specialty Exam and Suicide Risk Assessment completed by Attending Physician prior to discharge.  Discharge destination:  Home  Is patient on multiple antipsychotic therapies at discharge:  No   Has Patient had three or more failed trials of antipsychotic monotherapy by history:  No  Recommended Plan for Multiple Antipsychotic Therapies: NA  Allergies as of 02/17/2019   No Known Allergies     Medication List    STOP taking these medications   nicotine 21 mg/24hr patch Commonly known as: NICODERM CQ - dosed in mg/24 hours Replaced by: nicotine 14 mg/24hr patch     TAKE these medications     Indication  acamprosate 333 MG tablet Commonly known as: CAMPRAL Take 2 tablets (666 mg total) by mouth 3 (three) times daily with meals. For alcoholism  Indication: Abuse or Misuse of Alcohol   busPIRone 10 MG tablet Commonly known as: BUSPAR Take 1 tablet (10 mg total) by mouth 3 (three) times daily. For anxiety  Indication: Anxiety Disorder   hydrOXYzine 25 MG tablet Commonly known as: ATARAX/VISTARIL Take 1 tablet (25 mg total) by mouth 3 (three) times daily as needed for anxiety.  Indication: Feeling Anxious   nicotine 14 mg/24hr patch Commonly known as: NICODERM CQ - dosed in mg/24 hours Place 1 patch (14 mg total) onto the skin daily. (May buy from over the counter): For smoking cessation Replaces: nicotine 21 mg/24hr patch  Indication: Nicotine Addiction   traZODone 50 MG tablet Commonly known as: DESYREL Take 1 tablet (50 mg total) by mouth at bedtime as needed for sleep.  Indication: [place] to.   Specialty: Professional Counselor Why: Walk in hours for new patient appointments are Monday through Friday 8am to 1pm. Please bring photo ID, proof of income or insurance, and current medications. Please wear a mask. Contact information: [facility] [place] [address] [phone]          Follow-up recommendations: Activity:  As tolerated Diet: As recommended by your primary care doctor. Keep all scheduled follow-up appointments as recommended.   Comments: Prescriptions given at discharge.  Patient agreeable to plan.  Given opportunity to ask questions.  Appears to feel comfortable with discharge  denies any current suicidal or homicidal thought. Patient is also instructed prior to discharge to: Take all medications as prescribed by his/her mental healthcare provider. Report any adverse effects and or reactions from the medicines to his/her outpatient provider promptly. Patient has been instructed & cautioned: To not engage in alcohol and or illegal drug use while on prescription medicines. In the event of worsening symptoms, patient is instructed to call the crisis hotline, 911 and or go to the nearest ED for appropriate evaluation and treatment of symptoms. To follow-up with his/her primary care provider for your other medical issues, concerns and or health care needs.   Signed: Armandina Stammer, NP, PMHNP, FNP-BC 02/17/2019, 9:14 AM

## 2019-11-13 DEATH — deceased

## 2020-06-18 IMAGING — CT CT HEAD WITHOUT CONTRAST
3 series · 15 of 47 positions shown, 18 images · non-contrast
Comparison: None

CLINICAL DATA: Intoxicated, 2 unwitnessed falls

EXAM:
CT HEAD WITHOUT CONTRAST
CT MAXILLOFACIAL WITHOUT CONTRAST
CT CERVICAL SPINE WITHOUT CONTRAST
TECHNIQUE: Multidetector CT imaging of the head, cervical spine, and
maxillofacial structures were performed using the standard protocol
without intravenous contrast. Multiplanar CT image reconstructions
of the cervical spine and maxillofacial structures were also
generated. Right side of face marked with BB.

[Series 3: head wo · axial · 0.45mm/px · z∈[-88,+47]mm · 9 of 33 slices shown, 12 images]
[im 3/33  brain]
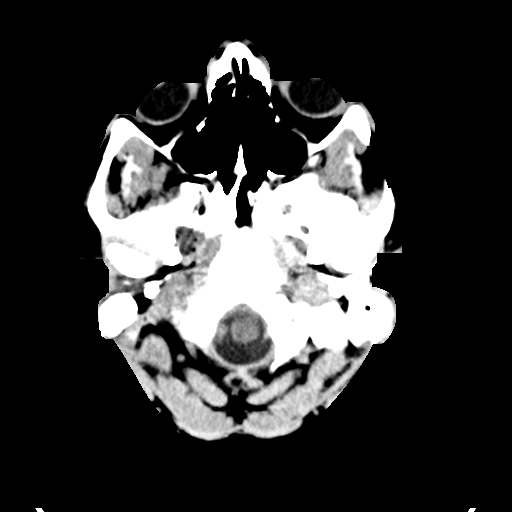
[im 3/33  bone]
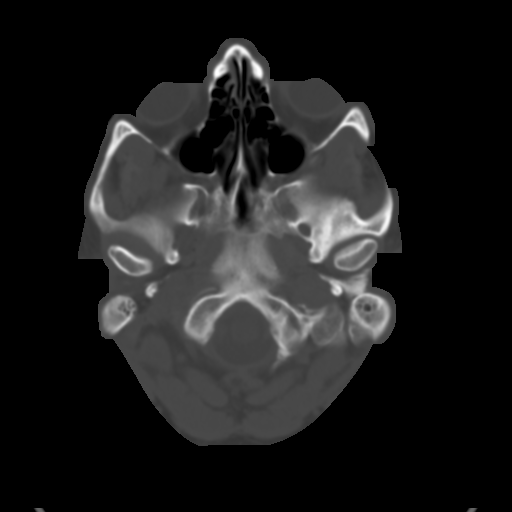
[im 6/33  brain]
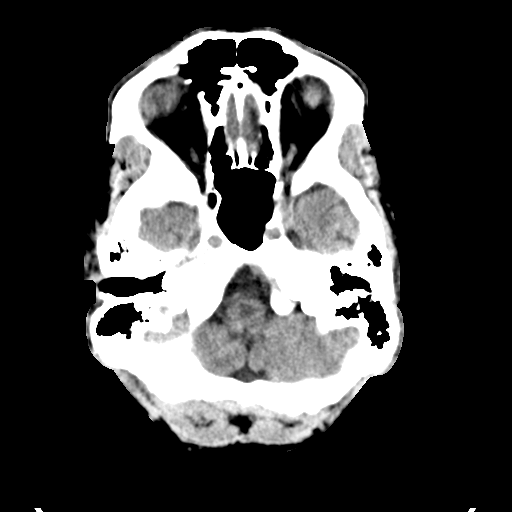
[im 9/33  brain]
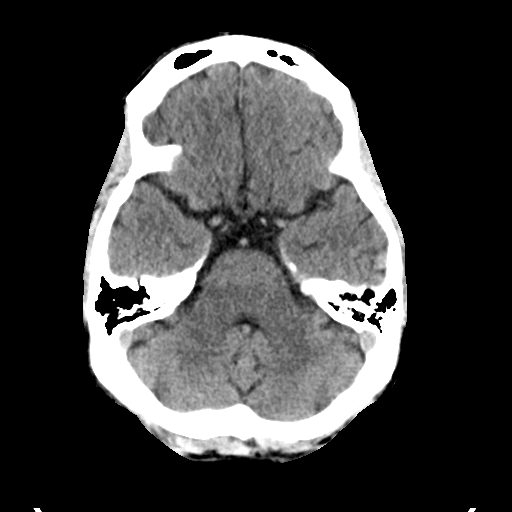
[im 13/33  brain]
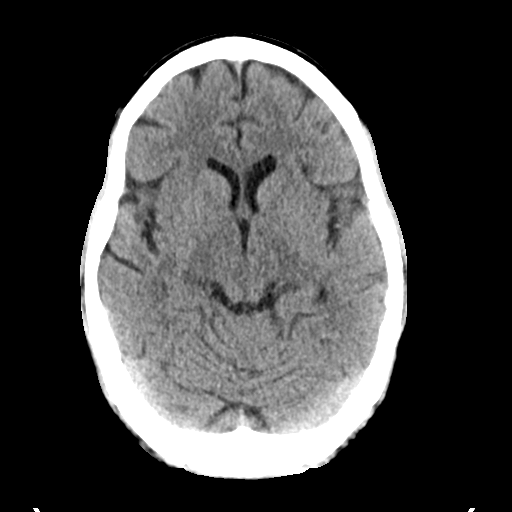
[im 17/33  brain]
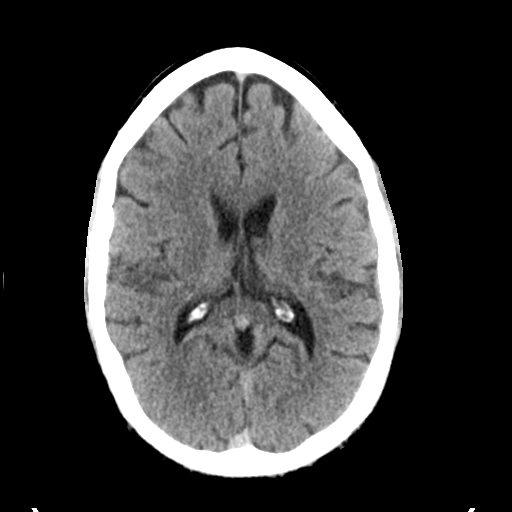
[im 17/33  bone]
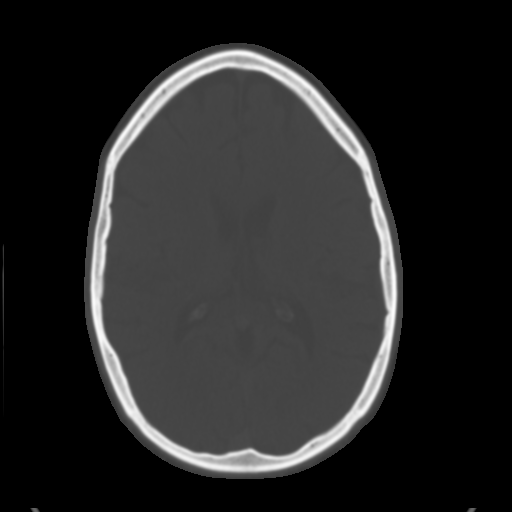
[im 20/33  brain]
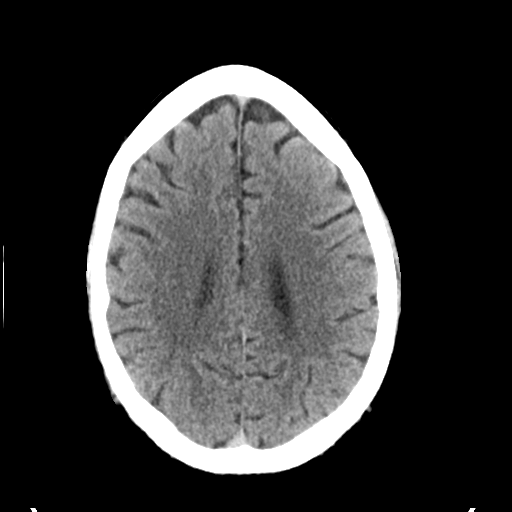
[im 24/33  brain]
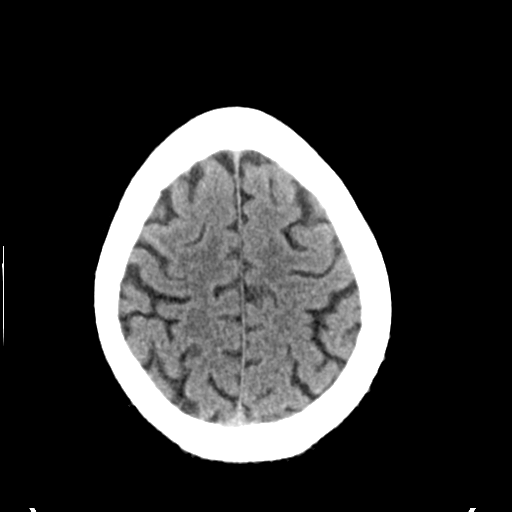
[im 27/33  brain]
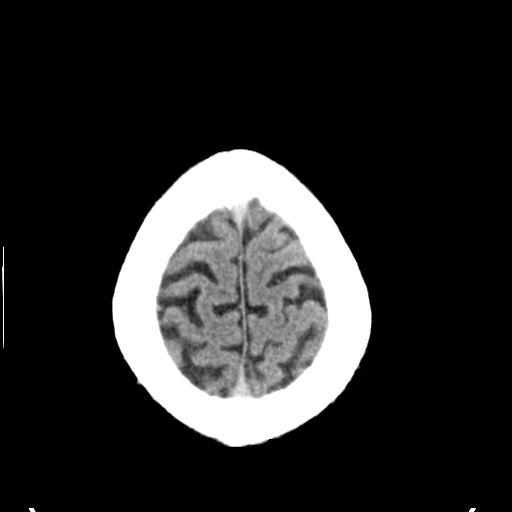
[im 30/33  brain]
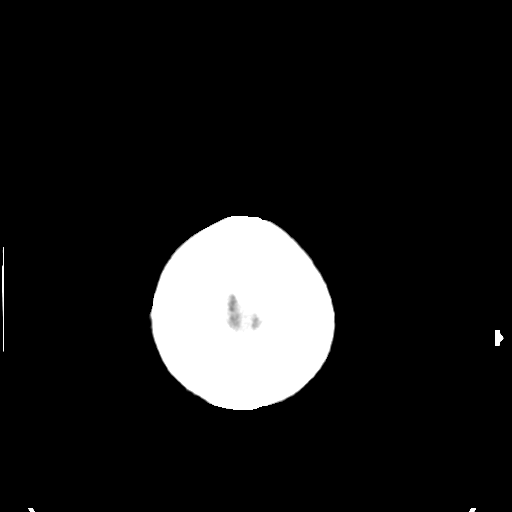
[im 30/33  bone]
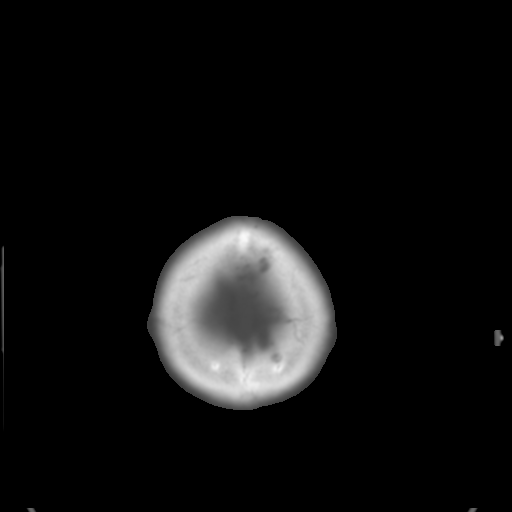

[Series 5: coronal soft tissue · coronal · 0.32mm/px · 3 of 70 slices shown]
[im 24/70  brain]
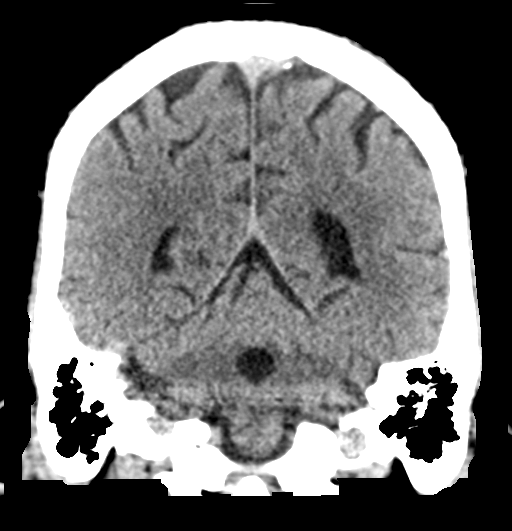
[im 31/70  brain]
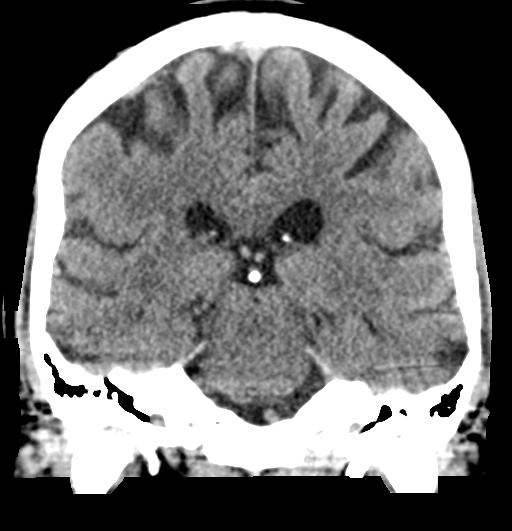
[im 39/70  brain]
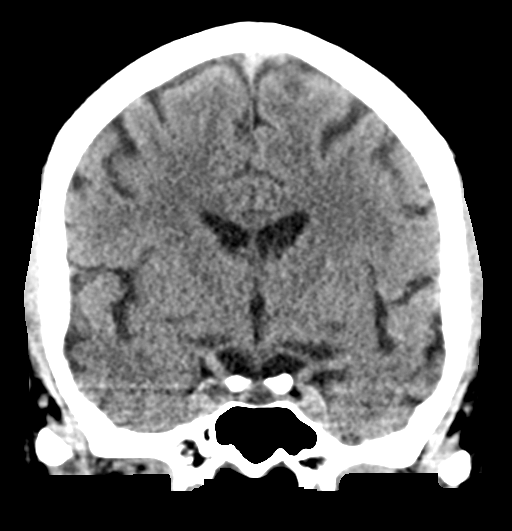

[Series 6: sagittal soft tissue · sagittal · 0.32mm/px · 3 of 53 slices shown]
[im 18/53  brain]
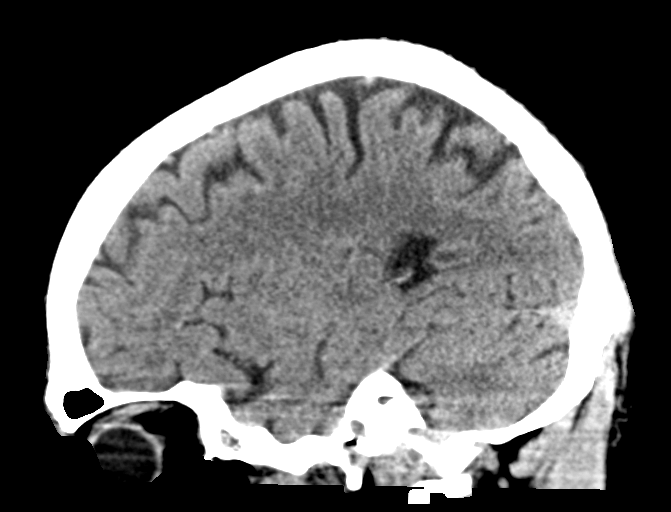
[im 27/53  brain]
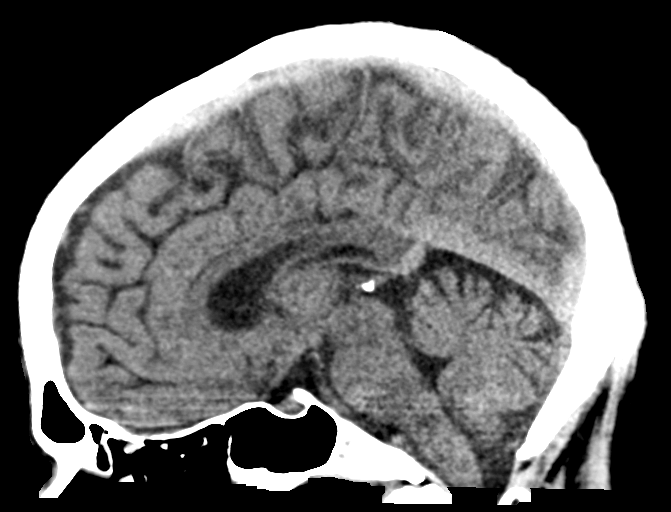
[im 35/53  brain]
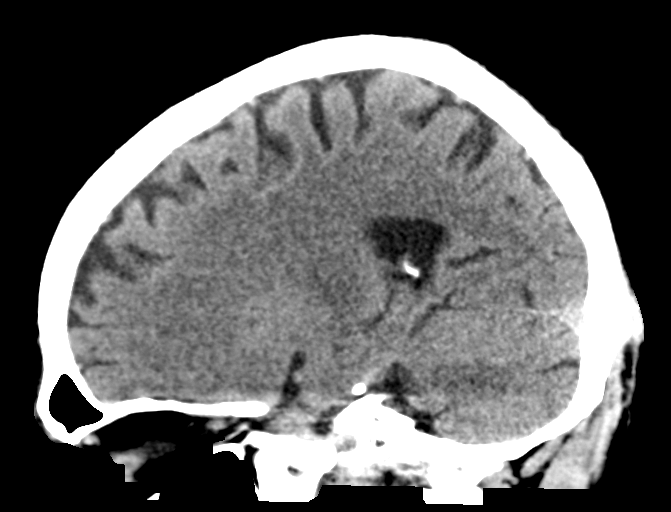

[15 of 47 positions shown; findings below may reference images not displayed]

FINDINGS: CT HEAD FINDINGS

Brain: Normal ventricular morphology. No midline shift or mass
effect. Mild cortical atrophy for age. No intracranial hemorrhage,
mass lesion, or evidence of acute infarction. No extra-axial fluid
collections.

Vascular: No hyperdense vessels

Skull: Calvaria intact

Other: N/A

CT MAXILLOFACIAL FINDINGS

Osseous: Osseous mineralization normal. TMJ alignment normal. Nasal
septal deviation to the RIGHT. No facial bone fractures identified.

Orbits: Bony orbits intact. Intraorbital soft tissue planes clear
without gas or fluid.

Sinuses: Paranasal sinuses, mastoid air cells, and middle ear
cavities clear bilaterally

Soft tissues: Unremarkable

CT CERVICAL SPINE FINDINGS

Alignment: Normal

Skull base and vertebrae: Skull base intact. Osseous mineralization
normal. Vertebral body and disc space heights maintained. No
fracture, subluxation or bone destruction.

Soft tissues and spinal canal: Prevertebral soft tissues normal
thickness. Remaining cervical soft tissues unremarkable

Disc levels:  No significant abnormalities

Upper chest: Tips of lung apices clear

Other: N/A
IMPRESSION: No acute intracranial abnormalities.

Normal CT cervical spine.

No acute facial bone abnormalities.
